# Patient Record
Sex: Female | Born: 1960 | Race: White | Hispanic: No | Marital: Married | Smoking: Never smoker
Health system: Southern US, Community
[De-identification: ages and names within clinical notes are randomized; demographics above are authoritative.]

## PROBLEM LIST (undated history)

## (undated) DIAGNOSIS — R7303 Prediabetes: Secondary | ICD-10-CM

## (undated) DIAGNOSIS — J309 Allergic rhinitis, unspecified: Secondary | ICD-10-CM

## (undated) DIAGNOSIS — F419 Anxiety disorder, unspecified: Secondary | ICD-10-CM

## (undated) DIAGNOSIS — M549 Dorsalgia, unspecified: Secondary | ICD-10-CM

## (undated) HISTORY — PX: DILATION AND CURETTAGE OF UTERUS: SHX78

## (undated) HISTORY — DX: Anxiety disorder, unspecified: F41.9

## (undated) HISTORY — DX: Dorsalgia, unspecified: M54.9

## (undated) HISTORY — DX: Allergic rhinitis, unspecified: J30.9

## (undated) HISTORY — DX: Prediabetes: R73.03

---

## 1996-05-18 HISTORY — PX: DIAGNOSTIC LAPAROSCOPY: SUR761

## 1997-05-18 HISTORY — PX: CHOLECYSTECTOMY: SHX55

## 2006-03-16 ENCOUNTER — Ambulatory Visit: Payer: Self-pay

## 2006-03-31 ENCOUNTER — Ambulatory Visit: Payer: Self-pay

## 2008-01-11 IMAGING — US ULTRASOUND RIGHT BREAST
1 series · 8 of 8 positions shown · non-contrast
Comparison: none

REASON FOR EXAM: Rt Breast Density
COMMENTS:

PROCEDURE:     US  - US BREAST RIGHT  - March 31, 2006  [DATE]
RESULT:          No cystic or solid abnormalities are identified.  A
negative ultrasound does not preclude biopsy if a palpable lesion is
present.

[Series 1: ultrasound right breast · 8 of 8 slices shown]
[im 1/8]
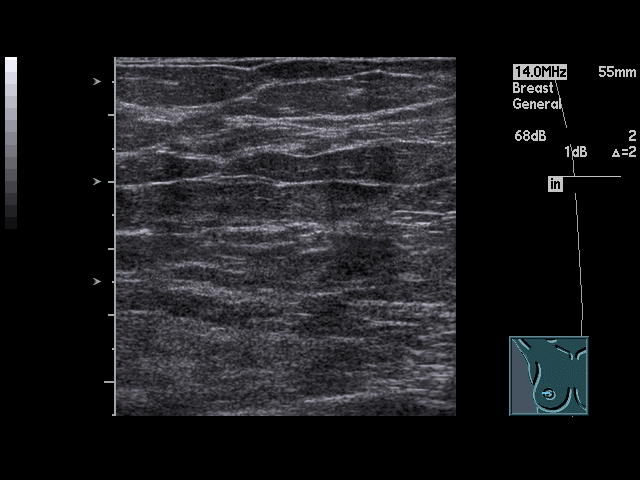
[im 2/8]
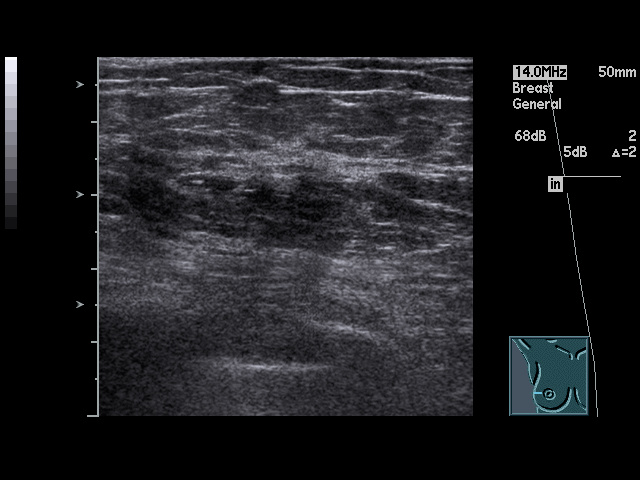
[im 3/8]
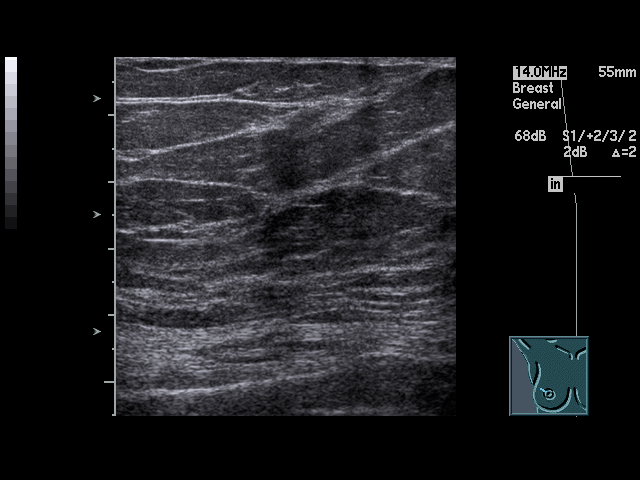
[im 4/8]
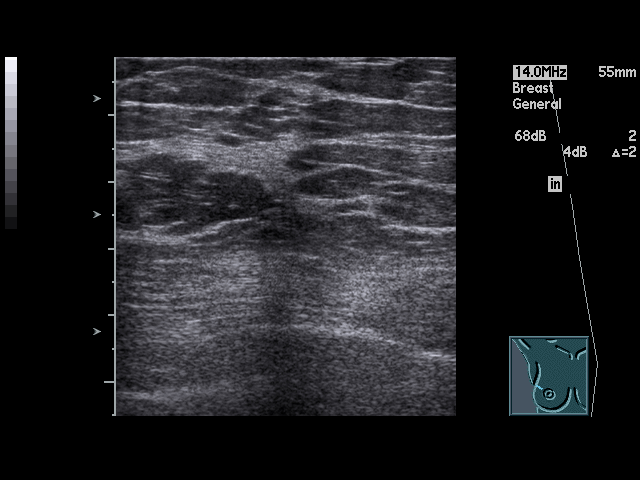
[im 5/8]
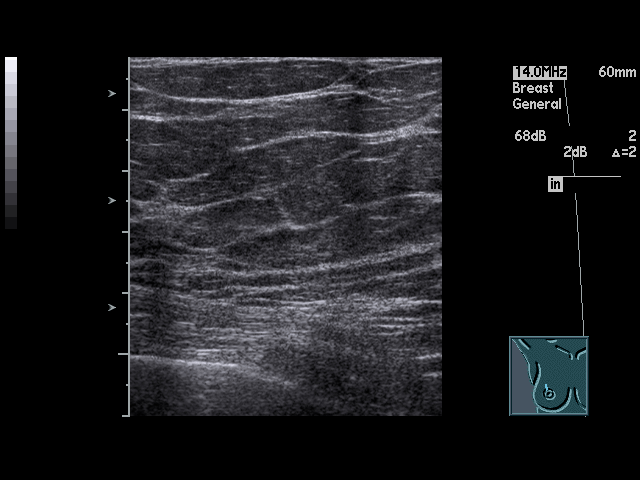
[im 6/8]
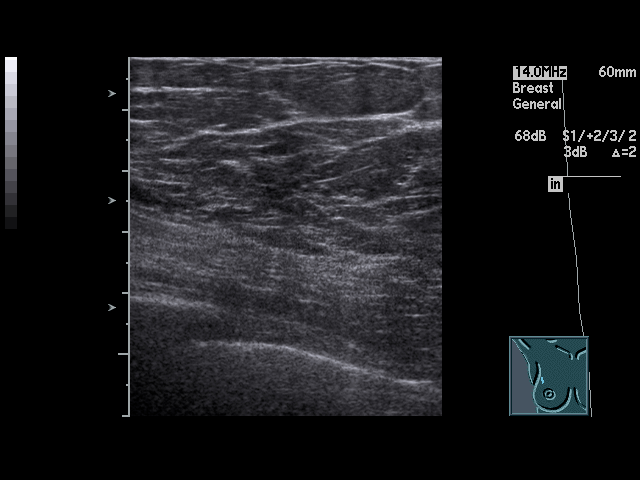
[im 7/8]
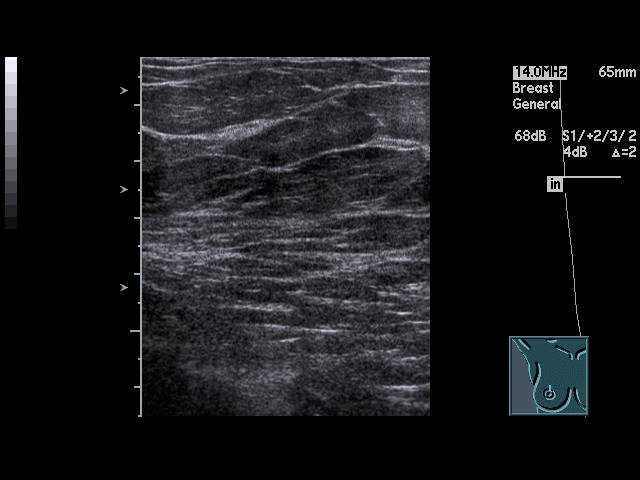
[im 8/8]
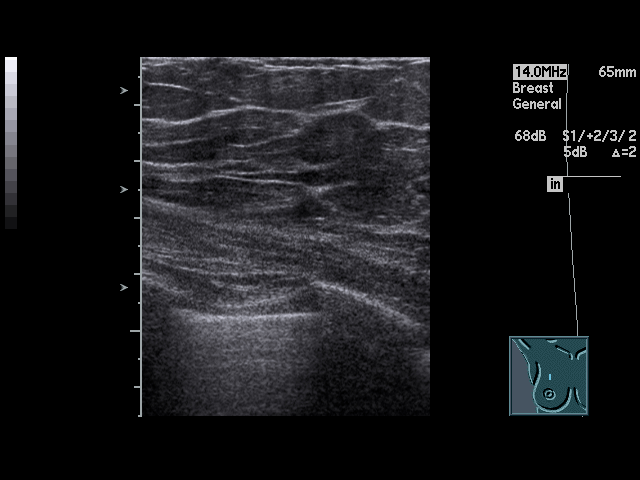

[8 of 8 positions shown; findings below may reference images not displayed]

IMPRESSION: Negative RIGHT breast ultrasound.

## 2014-02-17 LAB — HEPATIC FUNCTION PANEL
ALT: 23 U/L (ref 7–35)
AST: 20 U/L (ref 13–35)
Alkaline Phosphatase: 66 U/L (ref 25–125)
BILIRUBIN, TOTAL: 0.3 mg/dL

## 2014-02-17 LAB — BASIC METABOLIC PANEL
BUN: 12 mg/dL (ref 4–21)
Creatinine: 0.8 mg/dL (ref <=1.1)
Glucose: 108 mg/dL
Potassium: 4.7 mmol/L (ref 3.4–5.3)
SODIUM: 142 mmol/L (ref 137–147)

## 2014-02-17 LAB — CBC AND DIFFERENTIAL
HCT: 40 % (ref 36–46)
Hemoglobin: 13.6 g/dL (ref 12.0–16.0)
Neutrophils Absolute: 5 /uL
Platelets: 255 10*3/uL (ref 150–399)
WBC: 8.7 10*3/mL

## 2014-02-19 LAB — TSH: TSH: 3.16 u[IU]/mL (ref <=5.90)

## 2014-02-21 LAB — HEMOGLOBIN A1C: HEMOGLOBIN A1C: 5.7 % (ref 4.0–6.0)

## 2014-07-30 ENCOUNTER — Encounter: Payer: Self-pay | Admitting: *Deleted

## 2014-07-30 ENCOUNTER — Ambulatory Visit (INDEPENDENT_AMBULATORY_CARE_PROVIDER_SITE_OTHER): Payer: 59 | Admitting: Cardiovascular Disease

## 2014-07-30 ENCOUNTER — Encounter (INDEPENDENT_AMBULATORY_CARE_PROVIDER_SITE_OTHER): Payer: Self-pay

## 2014-07-30 VITALS — BP 118/70 | HR 82 | Ht 62.0 in | Wt 219.8 lb

## 2014-07-30 DIAGNOSIS — R079 Chest pain, unspecified: Secondary | ICD-10-CM

## 2014-07-30 DIAGNOSIS — Z6841 Body Mass Index (BMI) 40.0 and over, adult: Secondary | ICD-10-CM

## 2014-07-30 NOTE — Assessment & Plan Note (Signed)
She reports substernal chest tightness with some anginal of some atypical features. Most of the episodes are triggered by stress and anxiety. Cardiac exam is unremarkable. EKG with inverted T waves in the anterior and inferior leads. Due to that, I recommend evaluation with a treadmill nuclear stress test. Given her abnormal baseline ECG, a treadmill stress test alone is not interpretable.

## 2014-07-30 NOTE — Assessment & Plan Note (Signed)
Consider evaluation for sleep apnea given her symptoms of fatigue.

## 2014-07-30 NOTE — Patient Instructions (Addendum)
[facility]  Your caregiver has ordered a Stress Test with nuclear imaging. The purpose of this test is to evaluate the blood supply to your heart muscle. This procedure is referred to as a "Non-Invasive Stress Test." This is because other than having an IV started in your vein, nothing is inserted or "invades" your body. Cardiac stress tests are done to find areas of poor blood flow to the heart by determining the extent of coronary artery disease (CAD). Some patients exercise on a treadmill, which naturally increases the blood flow to your heart, while others who are  unable to walk on a treadmill due to physical limitations have a pharmacologic/chemical stress agent called Lexiscan . This medicine will mimic walking on a treadmill by temporarily increasing your coronary blood flow.   Please note: these test may take anywhere between 2-4 hours to complete  [facility]  THE VOLUNTEERS AT THE FIRST DESK WILL DIRECT YOU WHERE TO GO  Date of Procedure:______3/18/16_______________________________  Arrival Time for Procedure:______0745 am_______________________   PLEASE NOTIFY THE OFFICE AT LEAST 24 HOURS IN ADVANCE IF YOU ARE UNABLE TO KEEP YOUR APPOINTMENT.  [phone] AND  PLEASE NOTIFY [facility] AT LEAST 24 HOURS IN ADVANCE IF YOU ARE UNABLE TO KEEP YOUR APPOINTMENT. [phone]  How to prepare for your Myoview test:  1. Do not eat or drink after midnight 2. No caffeine for 24 hours prior to test 3. No smoking 24 hours prior to test. 4. Your medication may be taken with water.  If your doctor stopped a medication because of this test, do not take that medication. 5. Ladies, please do not wear dresses.  Skirts or pants are appropriate. Please wear a short sleeve shirt. 6. No perfume, cologne or lotion. 7. Wear comfortable walking shoes. No heels!   Your physician recommends that you schedule a follow-up appointment in:  As needed

## 2014-07-30 NOTE — Progress Notes (Signed)
  Primary care physician: Dr. Juanetta GoslingHawkins  HPI  This is a 54 year old female who was referred for evaluation of chest pain and abnormal EKG. She has known history of obesity, diet-controlled diabetes and allergic rhinitis. She has no history of hypertension, hyperlipidemia or tobacco use. She does not have family history of coronary artery disease. She complains of substernal chest tightness which started about 5 months ago and has been intermittent. This mostly happens when she is under stress but occasionally with physical activities but not on a consistent basis. She complains of fatigue. She complains of dizziness upon standing up. She is not aware of history of sleep apnea and has not been tested. She does complain of chronic exertional dyspnea. No orthopnea, PND or lower extremity edema. She is going through menopause. Recent EKGs were reviewed which showed sinus rhythm with nonspecific inferior and anterior T wave changes.  Allergies  Allergen Reactions  . Penicillins      No current outpatient prescriptions on file prior to visit.   No current facility-administered medications on file prior to visit.     Past Medical History  Diagnosis Date  . Back pain   . Obesity      Past Surgical History  Procedure Laterality Date  . Cholecystectomy       Family History  Problem Relation Age of Onset  . Family history unknown: Yes     History   Social History  . Marital Status: Married    Spouse Name: N/A  . Number of Children: N/A  . Years of Education: N/A   Occupational History  . Not on file.   Social History Main Topics  . Smoking status: Never Smoker   . Smokeless tobacco: Never Used  . Alcohol Use: No  . Drug Use: No  . Sexual Activity: Not on file   Other Topics Concern  . Not on file   Social History Narrative  . No narrative on file     ROS A 10 point review of system was performed. It is negative other than that mentioned in the history of present  illness.   PHYSICAL EXAM   BP 118/70 mmHg  Pulse 82  Ht 5\' 2"  (1.575 m)  Wt 219 lb 12 oz (99.678 kg)  BMI 40.18 kg/m2 Constitutional: She is oriented to person, place, and time. She appears well-developed and well-nourished. No distress.  HENT: No nasal discharge.  Head: Normocephalic and atraumatic.  Eyes: Pupils are equal and round. No discharge.  Neck: Normal range of motion. Neck supple. No JVD present. No thyromegaly present.  Cardiovascular: Normal rate, regular rhythm, normal heart sounds. Exam reveals no gallop and no friction rub. No murmur heard.  Pulmonary/Chest: Effort normal and breath sounds normal. No stridor. No respiratory distress. She has no wheezes. She has no rales. She exhibits no tenderness.  Abdominal: Soft. Bowel sounds are normal. She exhibits no distension. There is no tenderness. There is no rebound and no guarding.  Musculoskeletal: Normal range of motion. She exhibits no edema and no tenderness.  Neurological: She is alert and oriented to person, place, and time. Coordination normal.  Skin: Skin is warm and dry. No rash noted. She is not diaphoretic. No erythema. No pallor.  Psychiatric: She has a normal mood and affect. Her behavior is normal. Judgment and thought content normal.      ASSESSMENT AND PLAN

## 2014-08-03 ENCOUNTER — Other Ambulatory Visit: Payer: Self-pay

## 2014-08-03 DIAGNOSIS — R079 Chest pain, unspecified: Secondary | ICD-10-CM | POA: Diagnosis not present

## 2014-08-03 DIAGNOSIS — Z01818 Encounter for other preprocedural examination: Secondary | ICD-10-CM

## 2014-08-06 ENCOUNTER — Other Ambulatory Visit: Payer: Self-pay

## 2014-08-06 DIAGNOSIS — R079 Chest pain, unspecified: Secondary | ICD-10-CM

## 2014-08-10 ENCOUNTER — Telehealth: Payer: Self-pay | Admitting: *Deleted

## 2014-08-10 NOTE — Telephone Encounter (Signed)
erroe

## 2014-12-03 ENCOUNTER — Encounter: Payer: Self-pay | Admitting: Family Medicine

## 2014-12-03 ENCOUNTER — Ambulatory Visit (INDEPENDENT_AMBULATORY_CARE_PROVIDER_SITE_OTHER): Payer: 59 | Admitting: Family Medicine

## 2014-12-03 VITALS — BP 123/81 | HR 84 | Temp 98.9°F | Resp 17 | Ht 61.0 in | Wt 227.0 lb

## 2014-12-03 DIAGNOSIS — F3341 Major depressive disorder, recurrent, in partial remission: Secondary | ICD-10-CM

## 2014-12-03 DIAGNOSIS — F329 Major depressive disorder, single episode, unspecified: Secondary | ICD-10-CM

## 2014-12-03 DIAGNOSIS — F3340 Major depressive disorder, recurrent, in remission, unspecified: Secondary | ICD-10-CM | POA: Insufficient documentation

## 2014-12-03 DIAGNOSIS — F32A Depression, unspecified: Secondary | ICD-10-CM

## 2014-12-03 MED ORDER — ESCITALOPRAM OXALATE 10 MG PO TABS: 10.0000 mg | ORAL_TABLET | Freq: Every day | ORAL | Status: DC

## 2014-12-03 NOTE — Progress Notes (Signed)
Name: Haley Woods   MRN: [id number]    DOB: Nov 05, 1960   Date:12/03/2014       Progress Note  Subjective  Chief Complaint  Chief Complaint  Patient presents with  . Follow-up    4 month  . Depression    Patient states that the medication is working okay, she states that noticed medication makes it harder to get up in the morning.     HPI  For f/u of depression.  Has been on Lexapro x 6 weeks.  Taking 1/2 10 mg. Tablet daily.  Feeling of lack of care and decreased motiviaton.  Less anxious and agitated.  Feels about 4/10.   Past Medical History  Diagnosis Date  . Back pain   . Obesity   . Allergic rhinitis   . Anxiety   . Depression   . Pre-diabetes     History  Substance Use Topics  . Smoking status: Never Smoker   . Smokeless tobacco: Never Used  . Alcohol Use: No     Current outpatient prescriptions:  .  escitalopram (LEXAPRO) 10 MG tablet, Take 1 tablet by mouth daily., Disp: , Rfl:  .  fexofenadine (ALLEGRA) 180 MG tablet, Take 180 mg by mouth daily., Disp: , Rfl:  .  fluticasone (FLONASE) 50 MCG/ACT nasal spray, Place 2 sprays into both nostrils daily., Disp: , Rfl:  .  montelukast (SINGULAIR) 10 MG tablet, Take 10 mg by mouth at bedtime., Disp: , Rfl:  .  Multiple Vitamin (MULTIVITAMIN) tablet, Take 1 tablet by mouth daily., Disp: , Rfl:  .  naproxen (NAPROSYN) 500 MG tablet, Take 500 mg by mouth 2 (two) times daily as needed., Disp: , Rfl:  .  pseudoephedrine (SUDAFED) 30 MG tablet, Take 1 tablet by mouth as needed., Disp: , Rfl:  .  aspirin 81 MG tablet, Take 81 mg by mouth daily., Disp: , Rfl:  .  nitroGLYCERIN (NITROSTAT) 0.4 MG SL tablet, Place 1 tablet under the tongue as needed., Disp: , Rfl:   Allergies  Allergen Reactions  . Penicillins     Review of Systems  Constitutional: Negative for fever, chills and malaise/fatigue.  Eyes: Negative for blurred vision and double vision.  Respiratory: Negative for cough, sputum production, shortness of  breath and wheezing.   Cardiovascular: Negative for chest pain, palpitations, orthopnea, claudication and leg swelling.  Gastrointestinal: Negative for abdominal pain and blood in stool.  Genitourinary: Negative for dysuria, urgency and frequency.  Skin: Negative for rash.  Neurological: Negative for weakness and headaches.  Psychiatric/Behavioral: Positive for depression. The patient is not nervous/anxious.       Objective  Filed Vitals:   12/03/14 1436  BP: 123/81  Pulse: 84  Temp: 98.9 F (37.2 C)  TempSrc: Oral  Resp: 17  Height: 5\' 1"  (1.549 m)  Weight: 227 lb (102.967 kg)     Physical Exam  Constitutional: She is well-developed, well-nourished, and in no distress. No distress.  HENT:  Head: Normocephalic and atraumatic.  Neck: Normal range of motion. No thyromegaly present.  Cardiovascular: Normal rate, regular rhythm, normal heart sounds and intact distal pulses.  Exam reveals no gallop and no friction rub.   No murmur heard. Pulmonary/Chest: Effort normal and breath sounds normal. No respiratory distress. She has no wheezes. She has no rales.  Abdominal: Soft. Bowel sounds are normal. She exhibits no mass. There is no tenderness.  obese  Musculoskeletal: She exhibits no edema.  Lymphadenopathy:    She has no cervical adenopathy.  Psychiatric:  Affect still depressed.      No results found for this or any previous visit (from the past 2160 hour(s)).   Assessment & Plan  1. Depression

## 2014-12-03 NOTE — Patient Instructions (Signed)
She hs been taking 1/2 of a 10 mg. Lexapro daily for 6 weeks.  She is to increase this to 1 tablet daily starting tomorrow.

## 2015-02-13 ENCOUNTER — Encounter: Payer: Self-pay | Admitting: Family Medicine

## 2015-02-13 ENCOUNTER — Ambulatory Visit (INDEPENDENT_AMBULATORY_CARE_PROVIDER_SITE_OTHER): Payer: 59 | Admitting: Family Medicine

## 2015-02-13 VITALS — BP 112/74 | HR 74 | Temp 98.9°F | Resp 16 | Ht 61.0 in | Wt 220.4 lb

## 2015-02-13 DIAGNOSIS — F329 Major depressive disorder, single episode, unspecified: Secondary | ICD-10-CM

## 2015-02-13 DIAGNOSIS — F32A Depression, unspecified: Secondary | ICD-10-CM

## 2015-02-13 DIAGNOSIS — F419 Anxiety disorder, unspecified: Secondary | ICD-10-CM | POA: Insufficient documentation

## 2015-02-13 DIAGNOSIS — M549 Dorsalgia, unspecified: Secondary | ICD-10-CM | POA: Insufficient documentation

## 2015-02-13 DIAGNOSIS — J302 Other seasonal allergic rhinitis: Secondary | ICD-10-CM | POA: Insufficient documentation

## 2015-02-13 MED ORDER — ESCITALOPRAM OXALATE 20 MG PO TABS: 20.0000 mg | ORAL_TABLET | Freq: Every day | ORAL | Status: DC

## 2015-02-13 NOTE — Patient Instructions (Addendum)
Take Lexapro after getting home from work.   To get flu shot at work next month.

## 2015-02-13 NOTE — Progress Notes (Signed)
Name: Haley Woods   MRN: [id number]    DOB: 1960/05/19   Date:02/13/2015       Progress Note  Subjective  Chief Complaint  Chief Complaint  Patient presents with  . Depression    HPI Here for f/u of depression.  Overall feels better, but does feel sleepy a lot and some decrease motivation.  Anxitey, esp. With driving much better.  No problem-specific assessment & plan notes found for this encounter.   Past Medical History  Diagnosis Date  . Back pain   . Obesity   . Allergic rhinitis   . Anxiety   . Depression   . Pre-diabetes     Social History  Substance Use Topics  . Smoking status: Never Smoker   . Smokeless tobacco: Never Used  . Alcohol Use: No     Current outpatient prescriptions:  .  aspirin 81 MG tablet, Take 81 mg by mouth daily., Disp: , Rfl:  .  escitalopram (LEXAPRO) 10 MG tablet, Take 1 tablet (10 mg total) by mouth daily., Disp: 90 tablet, Rfl: 3 .  fexofenadine (ALLEGRA) 180 MG tablet, Take 180 mg by mouth daily., Disp: , Rfl:  .  fluticasone (FLONASE) 50 MCG/ACT nasal spray, Place 2 sprays into both nostrils daily., Disp: , Rfl:  .  montelukast (SINGULAIR) 10 MG tablet, Take 10 mg by mouth at bedtime., Disp: , Rfl:  .  Multiple Vitamin (MULTIVITAMIN) tablet, Take 1 tablet by mouth daily., Disp: , Rfl:  .  naproxen (NAPROSYN) 500 MG tablet, Take 500 mg by mouth 2 (two) times daily as needed., Disp: , Rfl:  .  nitroGLYCERIN (NITROSTAT) 0.4 MG SL tablet, Place 1 tablet under the tongue as needed., Disp: , Rfl:   Allergies  Allergen Reactions  . Penicillins     Review of Systems  Constitutional: Positive for malaise/fatigue. Negative for fever, chills and weight loss.  Eyes: Negative for blurred vision and double vision.  Respiratory: Negative for cough, sputum production, shortness of breath and wheezing.   Cardiovascular: Negative for chest pain, palpitations, orthopnea and leg swelling.  Gastrointestinal: Negative for heartburn, abdominal pain  and blood in stool.  Genitourinary: Negative for dysuria, urgency and frequency.  Musculoskeletal: Negative for myalgias and joint pain.  Neurological: Negative for weakness and headaches.  Psychiatric/Behavioral: Positive for depression (improved). The patient is nervous/anxious (improved).       Objective  Filed Vitals:   02/13/15 1325  BP: 112/74  Pulse: 74  Temp: 98.9 F (37.2 C)  TempSrc: Oral  Resp: 16  Height:  (1.549 m)  Weight: 220 lb 6.4 oz (99.973 kg)     Physical Exam  Constitutional: She is well-developed, well-nourished, and in no distress. No distress.  HENT:  Head: Normocephalic and atraumatic.  Eyes: Conjunctivae and EOM are normal. Pupils are equal, round, and reactive to light. No scleral icterus.  Neck: Normal range of motion. Neck supple. Carotid bruit is not present. No thyromegaly present.  Cardiovascular: Normal rate, regular rhythm, normal heart sounds and intact distal pulses.  Exam reveals no gallop and no friction rub.   No murmur heard. Pulmonary/Chest: Effort normal and breath sounds normal. No respiratory distress. She has no wheezes. She has no rales.  Musculoskeletal: She exhibits no edema.  Lymphadenopathy:    She has no cervical adenopathy.  Psychiatric:  Affect is sl. Flattened, but better than previously.  Vitals reviewed.     No results found for this or any previous visit (from the past  2160 hour(s)).   Assessment & Plan  1. Depression  - escitalopram (LEXAPRO) 20 MG tablet; Take 1 tablet (20 mg total) by mouth daily. Take in late afternoon.  Dispense: 30 tablet; Refill: 3  2. Anxiety  - escitalopram (LEXAPRO) 20 MG tablet; Take 1 tablet (20 mg total) by mouth daily. Take in late afternoon.  Dispense: 30 tablet; Refill: 3

## 2015-03-05 ENCOUNTER — Telehealth: Payer: Self-pay

## 2015-03-05 NOTE — Telephone Encounter (Signed)
Advised.JH  

## 2015-03-05 NOTE — Telephone Encounter (Signed)
Since increase to 2 Lexapro patient very sleepy. Wants to cut back on it.

## 2015-03-05 NOTE — Telephone Encounter (Signed)
Take 1 Lezapro a day for 1 week,then decrease to 1/2 tablet a day for 1 week, then 1/2 tab every other day for 1 week then stop.  We will try a different med at next visit.-jh

## 2015-03-27 ENCOUNTER — Encounter: Payer: Self-pay | Admitting: Family Medicine

## 2015-03-27 ENCOUNTER — Ambulatory Visit (INDEPENDENT_AMBULATORY_CARE_PROVIDER_SITE_OTHER): Payer: 59 | Admitting: Family Medicine

## 2015-03-27 VITALS — BP 132/87 | HR 102 | Temp 98.3°F | Resp 16 | Ht 61.0 in | Wt 221.6 lb

## 2015-03-27 DIAGNOSIS — J0101 Acute recurrent maxillary sinusitis: Secondary | ICD-10-CM | POA: Diagnosis not present

## 2015-03-27 MED ORDER — PSEUDOEPHEDRINE-GUAIFENESIN ER 60-600 MG PO TB12
1.0000 | ORAL_TABLET | Freq: Two times a day (BID) | ORAL | Status: DC
Start: 2015-03-27 — End: 2015-03-27

## 2015-03-27 MED ORDER — PSEUDOEPHEDRINE-GUAIFENESIN ER 60-600 MG PO TB12
1.0000 | ORAL_TABLET | Freq: Two times a day (BID) | ORAL | Status: DC
Start: 2015-03-27 — End: 2015-04-09

## 2015-03-27 MED ORDER — LEVOFLOXACIN 500 MG PO TABS: 500.0000 mg | ORAL_TABLET | Freq: Every day | ORAL | Status: AC

## 2015-03-27 MED ORDER — LEVOFLOXACIN 500 MG PO TABS: 500.0000 mg | ORAL_TABLET | Freq: Every day | ORAL | Status: DC

## 2015-03-27 MED ORDER — BENZONATATE 100 MG PO CAPS: 100.0000 mg | ORAL_CAPSULE | Freq: Three times a day (TID) | ORAL | Status: DC | PRN

## 2015-03-27 NOTE — Progress Notes (Signed)
Name: Haley Woods   MRN: [id number]    DOB: [birth year]   Date:03/27/2015       Progress Note  Subjective  Chief Complaint  Chief Complaint  Patient presents with  . Depression    still has some anxiety obtw pt has drainage and coughing at night onset 6 days no fever may be sinusitis and dizzy    HPI Here for f/u of depression and anxiety off meds, but c/o sinus infection with facial pressure beneath eyes and cough that is non-productive.  Cough is throaty.  No fever.    No problem-specific assessment & plan notes found for this encounter.   Past Medical History  Diagnosis Date  . Back pain   . Obesity   . Allergic rhinitis   . Anxiety   . Depression   . Pre-diabetes     Social History  Substance Use Topics  . Smoking status: Never Smoker   . Smokeless tobacco: Never Used  . Alcohol Use: No     Current outpatient prescriptions:  .  aspirin 81 MG tablet, Take 81 mg by mouth daily., Disp: , Rfl:  .  fexofenadine (ALLEGRA) 180 MG tablet, Take 180 mg by mouth daily., Disp: , Rfl:  .  fluticasone (FLONASE) 50 MCG/ACT nasal spray, Place 2 sprays into both nostrils daily., Disp: , Rfl:  .  montelukast (SINGULAIR) 10 MG tablet, Take 10 mg by mouth at bedtime., Disp: , Rfl:  .  Multiple Vitamin (MULTIVITAMIN) tablet, Take 1 tablet by mouth daily., Disp: , Rfl:  .  naproxen (NAPROSYN) 500 MG tablet, Take 500 mg by mouth 2 (two) times daily as needed., Disp: , Rfl:  .  nitroGLYCERIN (NITROSTAT) 0.4 MG SL tablet, Place 1 tablet under the tongue as needed., Disp: , Rfl:   Allergies  Allergen Reactions  . Penicillins     Review of Systems  Constitutional: Negative for fever, chills, weight loss and diaphoresis.  HENT: Negative for hearing loss.   Eyes: Negative for blurred vision and double vision.  Respiratory: Positive for cough and wheezing (some). Negative for sputum production and shortness of breath.   Cardiovascular: Negative for chest pain, palpitations and leg  swelling.  Gastrointestinal: Negative for heartburn, abdominal pain and blood in stool.  Genitourinary: Negative for dysuria, urgency and frequency.  Skin: Negative for rash.  Neurological: Negative for weakness and headaches.      Objective  Filed Vitals:   03/27/15 1354  BP: 132/87  Pulse: 102  Temp: 98.3 F (36.8 C)  TempSrc: Oral  Resp: 16  Height:  (1.549 m)  Weight: 221 lb 9.6 oz (100.517 kg)  SpO2: 98%     Physical Exam  Constitutional: She appears distressed (Appears to feel ill.).  HENT:  Head: Normocephalic and atraumatic.  Right Ear: External ear normal.  Left Ear: External ear normal.  Nose: Mucosal edema, rhinorrhea and sinus tenderness present. Right sinus exhibits maxillary sinus tenderness. Right sinus exhibits no frontal sinus tenderness. Left sinus exhibits maxillary sinus tenderness. Left sinus exhibits no frontal sinus tenderness.  Mouth/Throat: Oropharynx is clear and moist.  Neck: Normal range of motion. Neck supple. No thyromegaly present.  Cardiovascular: Normal rate, regular rhythm, normal heart sounds and intact distal pulses.  Exam reveals no gallop and no friction rub.   No murmur heard. Pulmonary/Chest: Effort normal and breath sounds normal. No respiratory distress. She has no wheezes. She has no rales.  Lymphadenopathy:    She has no cervical adenopathy.  Vitals  reviewed.     No results found for this or any previous visit (from the past 2160 hour(s)).   Assessment & Plan  1. Acute recurrent maxillary sinusitis  - levofloxacin (LEVAQUIN) 500 MG tablet; Take 1 tablet (500 mg total) by mouth daily.  Dispense: 10 tablet; Refill: 0 - pseudoephedrine-guaifenesin (MUCINEX D) 60-600 MG 12 hr tablet; Take 1 tablet by mouth every 12 (twelve) hours.  Dispense: 20 tablet; Refill: 0 - benzonatate (TESSALON PERLES) 100 MG capsule; Take 1 capsule (100 mg total) by mouth 3 (three) times daily as needed for cough.  Dispense: 30 capsule; Refill:  1

## 2015-03-27 NOTE — Patient Instructions (Signed)
Consider Cymbalta in the future.

## 2015-03-29 ENCOUNTER — Ambulatory Visit: Payer: 59 | Admitting: Family Medicine

## 2015-04-03 ENCOUNTER — Telehealth: Payer: Self-pay | Admitting: Family Medicine

## 2015-04-03 NOTE — Telephone Encounter (Signed)
Patient has one more day on abx (Levaquin). Patient is still taking Muccinex. Patient says ears are clogged up and she still has a lot of congestion. Patient says cough has gotten better. Please advise.

## 2015-04-03 NOTE — Telephone Encounter (Signed)
10 days of Levaquin are usually all that is needed.  If she has continued congestion, I would need to see her or refer her to ENT for further evaluation.   Levaquin is a major antibiotic.  She can be sure that she is taking an OTC antihistamine also (Claritin, Zyrtec, etc.).

## 2015-04-03 NOTE — Telephone Encounter (Signed)
Patient aware and will call back if she does not get bette in a few days.

## 2015-04-03 NOTE — Telephone Encounter (Signed)
Pt saw Dr. Juanetta GoslingHawkins on Wednesday last week and was prescribed antibiotics and OTC decongestant.  She will be out of both tomorrow.  Does she need to come in or can something be called in?  Her call back number is [phone]

## 2015-04-09 ENCOUNTER — Encounter: Payer: Self-pay | Admitting: Family Medicine

## 2015-04-09 ENCOUNTER — Ambulatory Visit (INDEPENDENT_AMBULATORY_CARE_PROVIDER_SITE_OTHER): Payer: 59 | Admitting: Family Medicine

## 2015-04-09 VITALS — BP 152/80 | HR 67 | Temp 98.6°F | Resp 16 | Ht 61.0 in | Wt 223.0 lb

## 2015-04-09 DIAGNOSIS — R059 Cough, unspecified: Secondary | ICD-10-CM

## 2015-04-09 DIAGNOSIS — J0101 Acute recurrent maxillary sinusitis: Secondary | ICD-10-CM | POA: Diagnosis not present

## 2015-04-09 DIAGNOSIS — R05 Cough: Secondary | ICD-10-CM

## 2015-04-09 DIAGNOSIS — R030 Elevated blood-pressure reading, without diagnosis of hypertension: Secondary | ICD-10-CM

## 2015-04-09 DIAGNOSIS — IMO0001 Reserved for inherently not codable concepts without codable children: Secondary | ICD-10-CM

## 2015-04-09 MED ORDER — PROMETHAZINE-CODEINE 6.25-10 MG/5ML PO SYRP: 5.0000 mL | ORAL_SOLUTION | Freq: Four times a day (QID) | ORAL | Status: DC | PRN

## 2015-04-09 MED ORDER — CHLORPHENIRAMINE-CODEINE 2-10 MG/5ML PO LIQD: 5.0000 mL | Freq: Every evening | ORAL | Status: DC | PRN

## 2015-04-09 MED ORDER — PSEUDOEPHEDRINE HCL 60 MG PO TABS: 60.0000 mg | ORAL_TABLET | Freq: Four times a day (QID) | ORAL | Status: DC | PRN

## 2015-04-09 MED ORDER — DM-GUAIFENESIN ER 30-600 MG PO TB12: 1.0000 | ORAL_TABLET | Freq: Two times a day (BID) | ORAL | Status: DC

## 2015-04-09 NOTE — Addendum Note (Signed)
Addended byLaroy Apple: Ludy Messamore L on: 04/09/2015 01:44 PM   Modules accepted: Orders, Medications

## 2015-04-09 NOTE — Progress Notes (Signed)
Subjective:    Patient ID: Haley Woods, female    DOB: 05/24/1960, 54 y.o.   MRN: [id number]  HPI: Haley Woods is a 54 y.o. female presenting on 04/09/2015 for Cough and Nasal Congestion   HPI  Pt presents for follow-up of cough, cold, congestion. Symptoms have been present for 2 weeks. She was previously treated with levaquin.  Symptoms were improving but symptoms worsened again on Saturday.  Ear congestion is better. Nasal congestion has worsened. No fevers at home. Pressure is resolved since antibiotic. Home treatment: Mucinex D, flonase, allegra, singulair at night. Pt was using tessalon perles.   Past Medical History  Diagnosis Date  . Back pain   . Obesity   . Allergic rhinitis   . Anxiety   . Depression   . Pre-diabetes     Current Outpatient Prescriptions on File Prior to Visit  Medication Sig  . aspirin 81 MG tablet Take 81 mg by mouth daily.  . fexofenadine (ALLEGRA) 180 MG tablet Take 180 mg by mouth daily.  . fluticasone (FLONASE) 50 MCG/ACT nasal spray Place 2 sprays into both nostrils daily.  . montelukast (SINGULAIR) 10 MG tablet Take 10 mg by mouth at bedtime.  . Multiple Vitamin (MULTIVITAMIN) tablet Take 1 tablet by mouth daily.  . naproxen (NAPROSYN) 500 MG tablet Take 500 mg by mouth 2 (two) times daily as needed.  . nitroGLYCERIN (NITROSTAT) 0.4 MG SL tablet Place 1 tablet under the tongue as needed.   No current facility-administered medications on file prior to visit.    Review of Systems  Constitutional: Positive for chills.  HENT: Positive for congestion, postnasal drip and rhinorrhea. Negative for ear pain, sinus pressure, sneezing and sore throat.   Respiratory: Positive for cough. Negative for chest tightness and wheezing.   Cardiovascular: Negative for chest pain and palpitations.  Gastrointestinal: Negative.  Negative for nausea, vomiting and diarrhea.  Musculoskeletal: Negative for neck pain and neck stiffness.  Neurological: Positive for  headaches.   Per HPI unless specifically indicated above     Objective:    BP 152/80 mmHg  Pulse 67  Temp(Src) 98.6 F (37 C) (Oral)  Resp 16  Ht  (1.549 m)  Wt 223 lb (101.152 kg)  BMI 42.16 kg/m2  Wt Readings from Last 3 Encounters:  04/09/15 223 lb (101.152 kg)  03/27/15 221 lb 9.6 oz (100.517 kg)  02/13/15 220 lb 6.4 oz (99.973 kg)    Physical Exam  Constitutional: She appears well-developed and well-nourished. No distress.  HENT:  Head: Normocephalic.  Right Ear: Tympanic membrane is not erythematous and not bulging.  Left Ear: Tympanic membrane is not erythematous and not bulging.  Nose: Mucosal edema, rhinorrhea and nasal septal hematoma present. No sinus tenderness. Right sinus exhibits maxillary sinus tenderness and frontal sinus tenderness. Left sinus exhibits no maxillary sinus tenderness and no frontal sinus tenderness.  Mouth/Throat: Uvula is midline and mucous membranes are normal. No uvula swelling. Posterior oropharyngeal erythema present. No posterior oropharyngeal edema.  Drainage seen in the back of the throat.   Neck: Neck supple. No Brudzinski's sign and no Kernig's sign noted.  Cardiovascular: Normal rate, regular rhythm and normal heart sounds.   Pulmonary/Chest: Breath sounds normal. No accessory muscle usage. No tachypnea. No respiratory distress.  Lymphadenopathy:    She has no cervical adenopathy.   Results for orders placed or performed in visit on 12/03/14  CBC and differential  Result Value Ref Range   Hemoglobin 13.6 12.0 -  16.0 g/dL   HCT 40 36 - 46 %   Neutrophils Absolute 5 /L   Platelets 255 150 - 399 K/L   WBC 8.7 10^3/mL  Basic metabolic panel  Result Value Ref Range   Glucose 108 mg/dL   BUN 12 4 - 21 mg/dL   Creatinine 0.8 .5 - 1.1 mg/dL   Potassium 4.7 3.4 - 5.3 mmol/L   Sodium 142 137 - 147 mmol/L  Hepatic function panel  Result Value Ref Range   Alkaline Phosphatase 66 25 - 125 U/L   ALT 23 7 - 35 U/L   AST 20 13 -  35 U/L   Bilirubin, Total 0.3 mg/dL  Hemoglobin H0QA1c  Result Value Ref Range   Hgb A1c MFr Bld 5.7 4.0 - 6.0 %  TSH  Result Value Ref Range   TSH 3.16 .41 - 5.90 uIU/mL      Assessment & Plan:   Problem List Items Addressed This Visit      Respiratory   Acute recurrent maxillary sinusitis - Primary    Infection appears to be resolved. Symptoms are likely lingering drainage. Increase sudafed. Continue antihistamine and flonase. Consider prednisone if symptoms not resolving. Consider ENT referral with recurrent symptoms.       Relevant Medications   dextromethorphan-guaiFENesin (MUCINEX DM) 30-600 MG 12hr tablet   pseudoephedrine (SUDAFED) 60 MG tablet   Chlorpheniramine-Codeine 2-10 MG/5ML LIQD    Other Visit Diagnoses    Cough        Likely upper airway cough syndrome- post nasal drip. Increase sudafed. Mucinex DM, and cough suppressant at night. Supportive care at home.     Relevant Medications    dextromethorphan-guaiFENesin (MUCINEX DM) 30-600 MG 12hr tablet    Chlorpheniramine-Codeine 2-10 MG/5ML LIQD    Elevated BP        BP elevated in the office today. Likely due to sudafed use and illness. Pt encouraged to monitor at home. Alarm symptoms reviewed.        Meds ordered this encounter  Medications  . dextromethorphan-guaiFENesin (MUCINEX DM) 30-600 MG 12hr tablet    Sig: Take 1 tablet by mouth 2 (two) times daily.    Dispense:  20 tablet    Refill:  0    Order Specific Question:  Supervising Provider    Answer:  Janeann ForehandHAWKINS JR, JAMES H [phone][[id number]]  . pseudoephedrine (SUDAFED) 60 MG tablet    Sig: Take 1 tablet (60 mg total) by mouth every 6 (six) hours as needed for congestion.    Dispense:  30 tablet    Refill:  0    Order Specific Question:  Supervising Provider    Answer:  Janeann ForehandHAWKINS JR, JAMES H [phone][[id number]]  . Chlorpheniramine-Codeine 2-10 MG/5ML LIQD    Sig: Take 5 mLs by mouth at bedtime as needed.    Dispense:  100 mL    Refill:  0    Order Specific Question:   Supervising Provider    Answer:  Janeann ForehandHAWKINS JR, JAMES H [[id number]]      Follow up plan: Return if symptoms worsen or fail to improve.

## 2015-04-09 NOTE — Assessment & Plan Note (Addendum)
Infection appears to be resolved. Symptoms are likely lingering drainage. Increase sudafed. Continue antihistamine and flonase. Consider prednisone if symptoms not resolving. Consider ENT referral with recurrent symptoms.

## 2015-04-09 NOTE — Patient Instructions (Signed)
I think your infection is resolved but the drainage is causing additional coughing.  Let's try taking a decongestant  (sudafed) three times daily.  Continue to take anti-histamine daily.  Saline nasal spray.  I suggest mucinex DM for cough. You can take that twice daily. I have given you a stronger cough medicine for a night.

## 2015-04-29 ENCOUNTER — Ambulatory Visit: Payer: 59 | Admitting: Family Medicine

## 2015-08-18 ENCOUNTER — Other Ambulatory Visit: Payer: Self-pay | Admitting: Family Medicine

## 2016-01-12 ENCOUNTER — Other Ambulatory Visit: Payer: Self-pay | Admitting: Family Medicine

## 2016-01-14 LAB — HEMOGLOBIN A1C: Hemoglobin A1C: 5.6

## 2016-01-14 LAB — LIPID PANEL
Cholesterol: 244 — AB (ref 0–200)
HDL: 60 (ref 35–70)
LDL Cholesterol: 157
TRIGLYCERIDES: 135 (ref 40–160)

## 2016-01-14 LAB — BASIC METABOLIC PANEL
Creatinine: 0.8 (ref 0.5–1.1)
GLUCOSE: 110

## 2016-02-14 ENCOUNTER — Ambulatory Visit (INDEPENDENT_AMBULATORY_CARE_PROVIDER_SITE_OTHER): Payer: 59 | Admitting: Family Medicine

## 2016-02-14 ENCOUNTER — Encounter: Payer: Self-pay | Admitting: Family Medicine

## 2016-02-14 VITALS — BP 121/84 | HR 74 | Temp 98.8°F | Resp 16 | Ht 61.0 in | Wt 216.0 lb

## 2016-02-14 DIAGNOSIS — E668 Other obesity: Secondary | ICD-10-CM | POA: Diagnosis not present

## 2016-02-14 DIAGNOSIS — IMO0002 Reserved for concepts with insufficient information to code with codable children: Secondary | ICD-10-CM

## 2016-02-14 NOTE — Patient Instructions (Signed)
To get flu shot at work.

## 2016-02-14 NOTE — Progress Notes (Signed)
Name: Haley Woods   MRN: [id number]    DOB: [birth year]   Date:02/14/2016       Progress Note  Subjective  Chief Complaint  Chief Complaint  Patient presents with  . Obesity    HPI Here to fill out form to request lower insurance rate because of obesity.  She has lost 7# over past 10 months.  She has changed to a diet higher in fats but lower in carbs.  She has not started any exercise program.    No problem-specific Assessment & Plan notes found for this encounter.   Past Medical History:  Diagnosis Date  . Allergic rhinitis   . Anxiety   . Back pain   . Depression   . Obesity   . Pre-diabetes     Past Surgical History:  Procedure Laterality Date  . CHOLECYSTECTOMY  1999    Family History  Problem Relation Age of Onset  . Colon cancer Father 68    Social History   Social History  . Marital status: Married    Spouse name: Dwayne  . Number of children: 1  . Years of education: N/A   Occupational History  .  [employer]   Social History Main Topics  . Smoking status: Never Smoker  . Smokeless tobacco: Never Used  . Alcohol use No  . Drug use: No  . Sexual activity: Not on file   Other Topics Concern  . Not on file   Social History Narrative  . No narrative on file     Current Outpatient Prescriptions:  .  fexofenadine (ALLEGRA) 180 MG tablet, Take 180 mg by mouth daily., Disp: , Rfl:  .  fluticasone (FLONASE) 50 MCG/ACT nasal spray, Use 2 sprays in each  nostril daily, Disp: 48 g, Rfl: 0 .  Multiple Vitamin (MULTIVITAMIN) tablet, Take 1 tablet by mouth daily., Disp: , Rfl:  .  naproxen (NAPROSYN) 500 MG tablet, Take 500 mg by mouth 2 (two) times daily as needed., Disp: , Rfl:  .  nitroGLYCERIN (NITROSTAT) 0.4 MG SL tablet, Place 1 tablet under the tongue as needed., Disp: , Rfl:   Allergies  Allergen Reactions  . Penicillins      Review of Systems  Constitutional: Positive for weight loss (mild, desired). Negative for chills, fever and  malaise/fatigue.  HENT: Negative for hearing loss.   Eyes: Negative for blurred vision and double vision.  Respiratory: Negative for cough, sputum production, shortness of breath and wheezing.   Cardiovascular: Negative for chest pain, palpitations and leg swelling.  Gastrointestinal: Negative for abdominal pain, blood in stool and heartburn.  Genitourinary: Negative for dysuria, frequency, hematuria and urgency.  Musculoskeletal: Negative for joint pain and myalgias.  Skin: Negative for rash.  Neurological: Negative for dizziness, tremors, weakness and headaches.      Objective  Vitals:   02/14/16 0824  BP: 121/84  Pulse: 74  Resp: 16  Temp: 98.8 F (37.1 C)  TempSrc: Oral  Weight: 216 lb (98 kg)  Height: 5\' 1"  (1.549 m)    Physical Exam  Constitutional: She is oriented to person, place, and time and well-developed, well-nourished, and in no distress. No distress.  HENT:  Head: Normocephalic and atraumatic.  Eyes: Conjunctivae and EOM are normal. Pupils are equal, round, and reactive to light. No scleral icterus.  Neck: Normal range of motion. Neck supple. Carotid bruit is not present. No thyromegaly present.  Cardiovascular: Normal rate, regular rhythm and normal heart sounds.  Exam reveals no gallop  and no friction rub.   No murmur heard. Pulmonary/Chest: Effort normal and breath sounds normal. No respiratory distress. She has no wheezes. She has no rales.  Abdominal: Soft. Bowel sounds are normal. She exhibits no distension, no abdominal bruit and no mass. There is no tenderness.  obese  Musculoskeletal: She exhibits no edema.  Lymphadenopathy:    She has no cervical adenopathy.  Neurological: She is alert and oriented to person, place, and time.  Vitals reviewed.      No results found for this or any previous visit (from the past 2160 hour(s)).   Assessment & Plan  Problem List Items Addressed This Visit      Other   Adult BMI 30+ - Primary    Other  Visit Diagnoses   None.     No orders of the defined types were placed in this encounter. 1. Adult BMI 30+ Discussed further calorie reduction and adding exercise to regemin to lose more weight.  Requested exception to attaining ideal weight by date desired by empoyer

## 2016-05-31 ENCOUNTER — Other Ambulatory Visit: Payer: Self-pay | Admitting: Family Medicine

## 2016-08-17 ENCOUNTER — Ambulatory Visit: Payer: 59 | Admitting: Family Medicine

## 2017-01-06 LAB — LIPID PANEL
CHOLESTEROL: 225 — AB (ref 0–200)
HDL: 55 (ref 35–70)
LDL Cholesterol: 141
TRIGLYCERIDES: 146 (ref 40–160)

## 2017-01-06 LAB — BASIC METABOLIC PANEL
CREATININE: 0.8 (ref 0.5–1.1)
Glucose: 102

## 2017-01-06 LAB — HEMOGLOBIN A1C: Hemoglobin A1C: 5.6

## 2017-01-25 ENCOUNTER — Ambulatory Visit (INDEPENDENT_AMBULATORY_CARE_PROVIDER_SITE_OTHER): Payer: 59 | Admitting: Family Medicine

## 2017-01-25 ENCOUNTER — Encounter: Payer: Self-pay | Admitting: Family Medicine

## 2017-01-25 VITALS — BP 130/82 | HR 59 | Temp 98.6°F | Resp 16 | Ht 61.0 in | Wt 224.6 lb

## 2017-01-25 DIAGNOSIS — Z1231 Encounter for screening mammogram for malignant neoplasm of breast: Secondary | ICD-10-CM | POA: Diagnosis not present

## 2017-01-25 DIAGNOSIS — Z0000 Encounter for general adult medical examination without abnormal findings: Secondary | ICD-10-CM | POA: Diagnosis not present

## 2017-01-25 DIAGNOSIS — Z1211 Encounter for screening for malignant neoplasm of colon: Secondary | ICD-10-CM | POA: Diagnosis not present

## 2017-01-25 DIAGNOSIS — R7303 Prediabetes: Secondary | ICD-10-CM

## 2017-01-25 DIAGNOSIS — Z1239 Encounter for other screening for malignant neoplasm of breast: Secondary | ICD-10-CM

## 2017-01-25 DIAGNOSIS — E7800 Pure hypercholesterolemia, unspecified: Secondary | ICD-10-CM | POA: Diagnosis not present

## 2017-01-25 DIAGNOSIS — E785 Hyperlipidemia, unspecified: Secondary | ICD-10-CM | POA: Insufficient documentation

## 2017-01-25 NOTE — Assessment & Plan Note (Signed)
Uncontrolled with poor lifestyle diet/exercise, now trying to improve. 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 - Recent wt gain 9 lbs in 1 year, some fluctuating weight - Never on wt loss meds - Concern Pre-DM, HLD. Elevated BP but improved re-check never HTN  Plan: 1. Reviewed wt loss strategies - start with improvements to diet and lifestyle, low carb, low portion, should eat breakfast and fast in evening if preferred, avoid late night eating, start cardio exercise more regularly 2. Offered nutritionist referral, she declined, can go to one at work [employer] 3. Consider future referral [facility] Dr Dalbert GarnetBeasley 4. Also consider med with Contrave in future - may check cost 5. Follow-up 6 months wt check

## 2017-01-25 NOTE — Assessment & Plan Note (Signed)
Uncontrolled cholesterol poor lifestyle, not on statin Last lipid panel 12/2016 - [employer] work biometric, entered Calculated ASCVD 10 yr risk score 2.3%  Plan: 1. Consider future ASA 81mg  in future now age 55>55 if inc risk factors for CVA 2. Encourage improved lifestyle - low carb/cholesterol, reduce portion size, start regular exercise 3. Yearly lipids

## 2017-01-25 NOTE — Progress Notes (Signed)
Subjective:    Patient ID: Haley Woods, female    DOB: Feb 15, 1961, 56 y.o.   MRN: [id number]  Haley Woods is a 56 y.o. female presenting on 01/25/2017 for Annual Exam   HPI   Here for Annual Physical and [employer] Biometric Lab Review / BMI Appeal  MORBID OBESITY BMI >42 - Reports that she would like to appeal the biometric BMI, now with not meeting requirement, same thing last year, states frustrating losing weight, body not responding to things that used to work in past. 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 - Never on medication for weight loss. Has not seen nutritionist Lifestyle: - Diet: Tries to work on low carb foods, poorer diet with acting as caregiver for family. Recently started intermediate fasting avoiding eating after 8pm evening also some fasting in morning, missing breakfast. Drinks some half and half tea, no more sodas and diet drinks. Drinks some water. - Exiercise: Home exercises on exercise ball and stretches, tries to do some every day, none recently. She may start walking at work - Also last menstrual period was August 2017, went through menopause. Admits some hormonal - Fam history of thyroid problem in father, older age. Her last TSH normal 2015. - No prior history of sleep apnea, never had PSG, denies snoring or apnea events  HYPERLIPIDEMIA: - Reports concerns with last lipids biometrics, 12/2016. Elevated TC, LDL  Pre-Diabetes Reports concerns with history of Pre-DM, recent A1c unchanged 5.6 from outside [employer] Never on meds for glucose.  Never diagnosed with HTN.  History of Depression, more with adjustment disorder and caregiver burden / stress, has been improved recently, no longer interested in anti-depressant medications.  Health Maintenance: - Cervical CA Screening: Last pap smear done in  02/2014, due anytime, unsure of result with HPV co testing, but reported "normal", she has [facility], will return when ready - Due for routine Hep C screening, has not had before that she recalls, not interested today - Colon CA Screening: Never had colonoscopy or other screening. Family history father passed away from colon cancer dx 71, passed 78. Will consider Cologuard. - Breast CA Screening: Last mammogram 3 years ago, previously normal, [facility] found abnormal scar tissue, then   Past Medical History:  Diagnosis Date  . Allergic rhinitis   . Anxiety   . Back pain   . Depression   . Obesity   . Pre-diabetes    Past Surgical History:  Procedure Laterality Date  . CHOLECYSTECTOMY  1999   Social History   Social History  . Marital status: Married    Spouse name: Dwayne  . Number of children: 1  . Years of education: N/A   Occupational History  .  [employer]   Social History Main Topics  . Smoking status: Never Smoker  . Smokeless tobacco: Never Used  . Alcohol use No  . Drug use: No  . Sexual activity: Not on file   Other Topics Concern  . Not on file   Social History Narrative  . No narrative on file   Family History  Problem Relation Age of Onset  . Heart disease Mother   . Congestive Heart Failure Mother   . Colon cancer Father 76       passed recurrence age 97   Current Outpatient  Prescriptions on File Prior to Visit  Medication Sig  . fexofenadine (ALLEGRA) 180 MG tablet Take 180 mg by mouth daily.  . fluticasone (FLONASE) 50 MCG/ACT nasal spray USE 2 SPRAYS IN EACH  NOSTRIL DAILY  . Multiple Vitamin (MULTIVITAMIN) tablet Take 1 tablet by mouth daily.  . naproxen (NAPROSYN) 500 MG tablet Take 500 mg by mouth 2 (two) times daily as needed.  . nitroGLYCERIN (NITROSTAT) 0.4 MG SL tablet Place 1 tablet under the tongue as needed.   No current facility-administered medications on file prior to visit.     Review of Systems    Constitutional: Negative for activity change, appetite change, chills, diaphoresis, fatigue, fever and unexpected weight change.  HENT: Negative for congestion, hearing loss and sinus pressure.   Eyes: Negative for visual disturbance.  Respiratory: Negative for apnea, cough, choking, chest tightness, shortness of breath and wheezing.   Cardiovascular: Negative for chest pain, palpitations and leg swelling.  Gastrointestinal: Negative for abdominal pain, anal bleeding, blood in stool, constipation, diarrhea, nausea and vomiting.  Endocrine: Negative for cold intolerance and polyuria.  Genitourinary: Negative for difficulty urinating, dysuria, frequency, hematuria and menstrual problem (amenorrhea for 1 year).  Musculoskeletal: Negative for arthralgias, back pain and neck pain.  Skin: Negative for rash.  Allergic/Immunologic: Negative for environmental allergies.  Neurological: Negative for dizziness, weakness, light-headedness, numbness and headaches.  Hematological: Negative for adenopathy.  Psychiatric/Behavioral: Negative for behavioral problems, dysphoric mood and sleep disturbance. The patient is not nervous/anxious.    Per HPI unless specifically indicated above     Objective:    BP 130/82 (BP Location: Right Arm, Cuff Size: Large)   Pulse (!) 59   Temp 98.6 F (37 C) (Oral)   Resp 16   Ht  (1.549 m)   Wt 224 lb 9.6 oz (101.9 kg)   LMP 01/17/2016 (Within Weeks)   BMI 42.44 kg/m   Wt Readings from Last 3 Encounters:  01/25/17 224 lb 9.6 oz (101.9 kg)  02/14/16 216 lb (98 kg)  04/09/15 223 lb (101.2 kg)    Physical Exam  Constitutional: She is oriented to person, place, and time. She appears well-developed and well-nourished. No distress.  Well-appearing, comfortable, cooperative, obese  HENT:  Head: Normocephalic and atraumatic.  Mouth/Throat: Oropharynx is clear and moist.  Frontal / maxillary sinuses non-tender. Nares patent without purulence or edema. Bilateral  TMs clear without erythema, effusion or bulging. Oropharynx clear without erythema, exudates, edema or asymmetry.  Eyes: Pupils are equal, round, and reactive to light. Conjunctivae and EOM are normal. Right eye exhibits no discharge. Left eye exhibits no discharge.  Neck: Normal range of motion. Neck supple. No thyromegaly present.  Cardiovascular: Normal rate, regular rhythm, normal heart sounds and intact distal pulses.   No murmur heard. Pulmonary/Chest: Effort normal and breath sounds normal. No respiratory distress. She has no wheezes. She has no rales.  Abdominal: Soft. Bowel sounds are normal. She exhibits no distension and no mass. There is no tenderness.  Musculoskeletal: Normal range of motion. She exhibits no edema or tenderness.  Upper / Lower Extremities: - Normal muscle tone, strength bilateral upper extremities 5/5, lower extremities 5/5  Lymphadenopathy:    She has no cervical adenopathy.  Neurological: She is alert and oriented to person, place, and time.  Distal sensation intact to light touch all extremities  Skin: Skin is warm and dry. No rash noted. She is not diaphoretic. No erythema.  Psychiatric: She has a normal mood and affect. Her behavior is  normal.  Well groomed, good eye contact, normal speech and thoughts  Nursing note and vitals reviewed.  Results for orders placed or performed in visit on 01/25/17  Basic metabolic panel  Result Value Ref Range   Glucose 110    Creatinine 0.8 0.5 - 1.1  Lipid panel  Result Value Ref Range   Triglycerides 135 40 - 160   Cholesterol 244 (A) 0 - 200   HDL 60 35 - 70   LDL Cholesterol 157   Hemoglobin A1c  Result Value Ref Range   Hemoglobin A1C 5.6   Basic metabolic panel  Result Value Ref Range   Glucose 102    Creatinine 0.8 0.5 - 1.1  Lipid panel  Result Value Ref Range   Triglycerides 146 40 - 160   Cholesterol 225 (A) 0 - 200   HDL 55 35 - 70   LDL Cholesterol 141   Hemoglobin A1c  Result Value Ref Range     Hemoglobin A1C 5.6       Assessment & Plan:   Problem List Items Addressed This Visit    Screening for colon cancer    Due for routine colon cancer screening. Never had colonoscopy (not interested), concern with family history colon cancer, father dx late [id number] and passed [id number] - Discussion today about recommendations for either Colonoscopy or Cologuard screening, benefits and risks of screening, interested in Cologuard, understands that if positive then recommendation is for diagnostic colonoscopy to follow-up. - Patient advised to contact insurance first to learn cost, will notify us when ready for us to order Cologuard      Pre-diabetes    Well-controlled Pre-DM with A1c 5.6, unchanged in 1 year, stable but with poor lifestyle Concern with obesity, HLD  Plan:  1. Not on any therapy currently  2. Encourage improved lifestyle - low carb, low sugar diet, reduce portion size, start regular exercise more cardio 3. Follow-up 6 months wt check, consider adding low dose metformin in future if still pre-DM      Morbid obesity (HCC)    Uncontrolled with poor lifestyle diet/exercise, now trying to improve. 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 - Recent wt gain 9 lbs in 1 year, some fluctuating weight - Never on wt loss meds - Concern Pre-DM, HLD. Elevated BP but improved re-check never HTN  Plan: 1. Reviewed wt loss strategies - start with improvements to diet and lifestyle, low carb, low portion, should eat breakfast and fast in evening if preferred, avoid late night eating, start cardio exercise more regularly 2. Offered nutritionist referral, she declined, can go to one at work [facility] 3. Consider future referral [facility] Dr Dalbert GarnetBeasley 4. Also consider med with Contrave in future - may check cost 5. Follow-up 6 months wt check      Hyperlipidemia    Uncontrolled cholesterol poor lifestyle, not on statin Last lipid panel 12/2016 - [facility] work  biometric, entered Calculated ASCVD 10 yr risk score 2.3%  Plan: 1. Consider future ASA 81mg  in future now age 36>55 if inc risk factors for CVA 2. Encourage improved lifestyle - low carb/cholesterol, reduce portion size, start regular exercise 3. Yearly lipids       Other Visit Diagnoses    Annual physical exam    -  Primary   Screening for breast cancer       Recommend mammogram screening, order placed, she will call [place] to schedule, concern with cost of last diagnostic by report.  Relevant Orders   MM DIGITAL SCREENING BILATERAL      No orders of the defined types were placed in this encounter.   Follow up plan: Return in about 6 months (around 07/25/2017) for Weight Check.  Completed labwork work biometric screening, appeal for failed BMI. Returned to patient, copy for chart.  Saralyn Pilar, DO [facility] 01/25/2017, 5:20 PM

## 2017-01-25 NOTE — Patient Instructions (Addendum)
Thank you for coming to the clinic today.  1. Completed BMI paperwork  2. Consider medication, Contrave in future, may check to see if insurance covers it  ------------------------------------------------------------------ WEIGHT MANAGEMENT  Dr Dennard Nip  [facility] [place], [place] [address] Ph: [phone]  For Mammogram screening for breast cancer   Call the [place] below anytime to schedule your own appointment now that order has been placed.  [facility] [place], [place] [address] Phone: [phone]  ------------------ Also consider calling [facility] for Pap Smear update, last one done in 2015, now has been about 3 years, may wait until total 5 years.  Colon Cancer Screening: - For all adults age 55+ routine colon cancer screening is highly recommended.     - Recent guidelines from [place] recommend starting age of 68 - Early detection of colon cancer is important, because often there are no warning signs or symptoms, also if found early usually it can be cured. Late stage is hard to treat.  - If you are not interested in Colonoscopy screening (if done and normal you could be cleared for 5 to 10 years until next due), then Cologuard is an excellent alternative for screening test for Colon Cancer. It is highly sensitive for detecting DNA of colon cancer from even the earliest stages. Also, there is NO bowel prep required. - If Cologuard is NEGATIVE, then it is good for 3 years before next due - If Cologuard is POSITIVE, then it is strongly advised to get a Colonoscopy, which allows the GI doctor to locate the source of the cancer or polyp (even very early stage) and treat it by removing it. ------------------------- If you would like to proceed with Cologuard (stool DNA test) - FIRST, call your insurance company and tell them you  want to check cost of Cologuard tell them CPT Code [id number] (it may be completely covered and you could get for no cost, OR max cost without any coverage is about $600). Also, keep in mind if you do NOT open the kit, and decide not to do the test, you will NOT be charged, you should contact the company if you decide not to do the test. - If you want to proceed, you can notify us (phone message, [place], or at next visit) and we will order it for you. The test kit will be delivered to you house within about 1 week. Follow instructions to collect sample, you may call the company for any help or questions, 24/7 telephone support at [phone].  Please schedule a Follow-up Appointment to: Return in about 6 months (around 07/25/2017) for Weight Check.  If you have any other questions or concerns, please feel free to call the clinic or send a message through [place]. You may also schedule an earlier appointment if necessary.  Additionally, you may be receiving a survey about your experience at our clinic within a few days to 1 week by e-mail or mail. We value your feedback.  Nobie Putnam, DO [facility]

## 2017-01-25 NOTE — Assessment & Plan Note (Signed)
Due for routine colon cancer screening. Never had colonoscopy (not interested), concern with family history colon cancer, father dx late 2170s and passed 1884 - Discussion today about recommendations for either Colonoscopy or Cologuard screening, benefits and risks of screening, interested in Cologuard, understands that if positive then recommendation is for diagnostic colonoscopy to follow-up. - Patient advised to contact insurance first to learn cost, will notify us when ready for us to order Cologuard

## 2017-01-25 NOTE — Assessment & Plan Note (Signed)
Well-controlled Pre-DM with A1c 5.6, unchanged in 1 year, stable but with poor lifestyle Concern with obesity, HLD  Plan:  1. Not on any therapy currently  2. Encourage improved lifestyle - low carb, low sugar diet, reduce portion size, start regular exercise more cardio 3. Follow-up 6 months wt check, consider adding low dose metformin in future if still pre-DM

## 2017-07-05 ENCOUNTER — Ambulatory Visit: Payer: Managed Care, Other (non HMO) | Admitting: Family Medicine

## 2017-07-05 ENCOUNTER — Encounter: Payer: Self-pay | Admitting: Family Medicine

## 2017-07-05 VITALS — BP 138/80 | HR 87 | Temp 98.8°F | Resp 16 | Ht 61.0 in | Wt 226.0 lb

## 2017-07-05 DIAGNOSIS — R002 Palpitations: Secondary | ICD-10-CM

## 2017-07-05 DIAGNOSIS — F419 Anxiety disorder, unspecified: Secondary | ICD-10-CM

## 2017-07-05 MED ORDER — BUSPIRONE HCL 5 MG PO TABS: 5.0000 mg | ORAL_TABLET | Freq: Two times a day (BID) | ORAL | 2 refills | Status: DC | PRN

## 2017-07-05 NOTE — Assessment & Plan Note (Signed)
Suspected to be related to anxiety/stress/mood and possibly caffeine intake Prior work-up cardiac negative in 2016 including stress test Now mild intermittent symptoms Still drinking coffee at night and tea during day high caffeine amount Advised to stop coffee in PM Start Buspar for regulating anxiety symptoms Future consider BB if need for symptoms Follow-up 6 weeks - future may need labs and possible return to cardiology if worsening criteria given

## 2017-07-05 NOTE — Progress Notes (Signed)
Subjective:    Patient ID: Haley Woods, female    DOB: 1960-08-06, 57 y.o.   MRN: [id number]  Haley Woods is a 57 y.o. female presenting on 07/05/2017 for Palpitations (as per patient last week felt palpitation in morning has hx of anxiety could be postmenoposal)   HPI  Palpitations, Intermittent / History of Anxiety and Mood Disorder - Reports symptoms started about 1 week ago with episodic "chest or heart fluttering" seems to notice it more in morning when first waking up, then seems to improve and go away, seems to be brief episodes, no particular trigger for symptoms, she is active with piliates and this does not trigger symptom. Does not notice more palpitations in evening when resting - Prior cardiac work-up in 2016, with some atypical chest pains and anxiety stress, and she had stress test and overall testing was normal, she was started on SSRI Lexapro 10-20mg  but she did not like the way she felt "not caring about anything" but it seemed to help calm her down - Drinks caffeine tea 16 oz during day, and coffee x 2 cups - Exercise with piliates and home exercise ball - Diet: low carb not working - [employer] prior weight gain - Admits significant life stressors and feels anxious often, but likes to treat naturally - She can usually function well still, just got promotion at work, but does less work/active on weekends, states she "retreats within herself" - Only recent change she used a new Essential Oil ("balanace") uses on her chest at times, unsure if this affected her, otherwise using Serenity Oil that has helped her before used it long-term - No known fam history of CAD/MI in earlier life. Mother/grandparents age >11 - Denies any chest pains, chest tightness, dyspnea, sweating, nausea, vomiting  Considered HM topics, did not discuss in detail today due to time.  Depression screen [facility] 2/9 07/05/2017 02/13/2015 12/03/2014  Decreased Interest 1 1 1   Down, Depressed, Hopeless 1 0 0    PHQ - 2 Score 2 1 1   Altered sleeping 1 - -  Tired, decreased energy 2 - -  Change in appetite 1 - -  Feeling bad or failure about yourself  1 - -  Trouble concentrating 0 - -  Moving slowly or fidgety/restless 0 - -  Suicidal thoughts 0 - -  PHQ-9 Score 7 - -  Difficult doing work/chores Somewhat difficult - -   GAD 7 : Generalized Anxiety Score 07/05/2017  Nervous, Anxious, on Edge 1  Control/stop worrying 1  Worry too much - different things 1  Trouble relaxing 1  Restless 1  Easily annoyed or irritable 1  Afraid - awful might happen 0  Total GAD 7 Score 6  Anxiety Difficulty Not difficult at all    Social History   Tobacco Use  . Smoking status: Never Smoker  . Smokeless tobacco: Never Used  Substance Use Topics  . Alcohol use: No    Alcohol/week: 0.0 oz  . Drug use: No    Review of Systems Per HPI unless specifically indicated above     Objective:    BP 138/80   Pulse 87   Temp 98.8 F (37.1 C) (Oral)   Resp 16   Ht 5\' 1"  (1.549 m)   Wt 226 lb (102.5 kg)   LMP 01/17/2016 (Within Weeks)   BMI 42.70 kg/m   Wt Readings from Last 3 Encounters:  07/05/17 226 lb (102.5 kg)  01/25/17 224 lb 9.6 oz (101.9  kg)  02/14/16 216 lb (98 kg)    Physical Exam  Constitutional: She is oriented to person, place, and time. She appears well-developed and well-nourished. No distress.  Well-appearing, comfortable, cooperative, obese  HENT:  Head: Normocephalic and atraumatic.  Mouth/Throat: Oropharynx is clear and moist.  Eyes: Conjunctivae are normal. Right eye exhibits no discharge. Left eye exhibits no discharge.  Neck: Normal range of motion. Neck supple. No thyromegaly present.  Cardiovascular: Normal rate, regular rhythm, normal heart sounds and intact distal pulses.  No murmur heard. Pulmonary/Chest: Effort normal and breath sounds normal. No respiratory distress. She has no wheezes. She has no rales.  Musculoskeletal: Normal range of motion. She exhibits no  edema.  Lymphadenopathy:    She has no cervical adenopathy.  Neurological: She is alert and oriented to person, place, and time.  Skin: Skin is warm and dry. No rash noted. She is not diaphoretic. No erythema.  Psychiatric: She has a normal mood and affect. Her behavior is normal.  Well groomed, good eye contact, normal speech and thoughts. Mildly anxious appearing but does seem calm in discussion on her concerns today. Good insight into health  Nursing note and vitals reviewed.  Results for orders placed or performed in visit on 01/25/17  Basic metabolic panel  Result Value Ref Range   Glucose 110    Creatinine 0.8 0.5 - 1.1  Lipid panel  Result Value Ref Range   Triglycerides 135 40 - 160   Cholesterol 244 (A) 0 - 200   HDL 60 35 - 70   LDL Cholesterol 157   Hemoglobin A1c  Result Value Ref Range   Hemoglobin A1C 5.6   Basic metabolic panel  Result Value Ref Range   Glucose 102    Creatinine 0.8 0.5 - 1.1  Lipid panel  Result Value Ref Range   Triglycerides 146 40 - 160   Cholesterol 225 (A) 0 - 200   HDL 55 35 - 70   LDL Cholesterol 141   Hemoglobin A1c  Result Value Ref Range   Hemoglobin A1C 5.6       Assessment & Plan:   Problem List Items Addressed This Visit    Anxiety - Primary    Suspected adjustment disorder w/ anxiety mixed depressed mood, difficult to determine underlying primary symptom or cause. Now with gradual worsening but not seem to affect her daily function, got promotion at work. -GAD7: 6 not difficult / PHQ9: 7 somewhat - Failed: Lexapro 10-20mg  (worked but made her feel out of it) - No prior dx / Psych / counseling  Plan: 1. Discussion on ideally need to control mood/anxiety/depression first to help her reported symptoms of palpitations intermittently and other factors 2. Start Buspar 5mg  BID PRN - may use regularly for 1-2 weeks then can reduce to PRN or continue longer, given she does not like taking rx medications 3. Advised recommend  therapy / counseling in future - handout given with self referral info - consider [place] office psychology in house 4. Follow-up 4-6 weeks anxiety, med adjust, GAD7/PHQ9      Relevant Medications   busPIRone (BUSPAR) 5 MG tablet   Intermittent palpitations    Suspected to be related to anxiety/stress/mood and possibly caffeine intake Prior work-up cardiac negative in 2016 including stress test Now mild intermittent symptoms Still drinking coffee at night and tea during day high caffeine amount Advised to stop coffee in PM Start Buspar for regulating anxiety symptoms Future consider BB if need for symptoms Follow-up  6 weeks - future may need labs and possible return to cardiology if worsening criteria given      Morbid obesity (HCC)    Uncontrolled still with some wt gain, mostly poor lifestyle diet/exercise only partially improved. 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 Elevated BP but improved re-check never HTN  Plan: 1. Reviewed wt loss strategies - seems to benefit from diet changes, exercise is limited only piliates at the moment, will try to improve 2. Consider future referral Obesity Clinic Dr Dalbert Garnet 3. Also consider med with Contrave in future - may check cost 4. Concern anxiety/stress driving weight - treat with buspar 5. Follow-up 6 weeks         Meds ordered this encounter  Medications  . busPIRone (BUSPAR) 5 MG tablet    Sig: Take 1 tablet (5 mg total) by mouth 2 (two) times daily as needed (anxiety).    Dispense:  60 tablet    Refill:  2      Follow up plan: Return in about 6 weeks (around 08/16/2017) for Anxiety/Mood PHQ/GAD, med adjust.   Saralyn Pilar, DO [facility] 07/05/2017, 9:44 AM

## 2017-07-05 NOTE — Patient Instructions (Addendum)
Thank you for coming to the office today.  1.  As discussed, it sounds like your symptoms are primarily related to anxiety / adjustment disorder. This is a very common problem and be related to several factors, including life stressors. Start treatment with Buspirone 5mg , take one pill twice daily for next 2-3 weeks. As discussed most anxiety medications are also used for mood disorders such as depression, because they work on similar chemicals in your brain. It may take up to 1-2 weeks for the medicine to take full effect and for you to notice a difference, sometimes you may notice it working sooner, otherwise we may need to adjust the dose.  If it is helping and you dont want to take it longer term, then I would consider reducing to PRN only as needed - take it 1-2 times daily for 1 week at a time when you feel more stressed and need it  For most patients with anxiety or mood concerns, we generally recommend referral to establish with a therapist or counselor as well. This has been shown to improve the effectiveness of the medications, and in the future we may be able to taper off medications.   ---------------------------------------------------------------------------------------------------------  PSYCHOLOGY COUNSELING ONLY  Delight Ovenssman Mostafa, LCSW [address] [place], [place] [id number] [place]: [phone]  ------------------------------------------------------  WEIGHT MANAGEMENT  Dr Quillian Quincearen Beasley  [facility] [address] [place], [place] [address] Ph: [phone]   Please schedule a Follow-up Appointment to: Return in about 6 weeks (around 08/16/2017) for Anxiety/Mood PHQ/GAD, med adjust.    If you have any other questions or concerns, please feel free to call the office or send a message through MyChart. You may also schedule an earlier appointment if necessary.  Additionally, you may be receiving a survey about your  experience at our office within a few days to 1 week by e-mail or mail. We value your feedback.  Saralyn PilarAlexander Ailee Pates, DO [facility]

## 2017-07-05 NOTE — Assessment & Plan Note (Signed)
Uncontrolled still with some wt gain, mostly poor lifestyle diet/exercise only partially improved. 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 Elevated BP but improved re-check never HTN  Plan: 1. Reviewed wt loss strategies - seems to benefit from diet changes, exercise is limited only piliates at the moment, will try to improve 2. Consider future referral Obesity Clinic Dr Dalbert GarnetBeasley 3. Also consider med with Contrave in future - may check cost 4. Concern anxiety/stress driving weight - treat with buspar 5. Follow-up 6 weeks

## 2017-07-05 NOTE — Assessment & Plan Note (Signed)
Suspected adjustment disorder w/ anxiety mixed depressed mood, difficult to determine underlying primary symptom or cause. Now with gradual worsening but not seem to affect her daily function, got promotion at work. -GAD7: 6 not difficult / PHQ9: 7 somewhat - Failed: Lexapro 10-20mg  (worked but made her feel out of it) - No prior dx / Psych / counseling  Plan: 1. Discussion on ideally need to control mood/anxiety/depression first to help her reported symptoms of palpitations intermittently and other factors 2. Start Buspar 5mg  BID PRN - may use regularly for 1-2 weeks then can reduce to PRN or continue longer, given she does not like taking rx medications 3. Advised recommend therapy / counseling in future - handout given with self referral info - consider  office psychology in house 4. Follow-up 4-6 weeks anxiety, med adjust, GAD7/PHQ9

## 2017-08-17 ENCOUNTER — Ambulatory Visit: Payer: Managed Care, Other (non HMO) | Admitting: Family Medicine

## 2017-08-17 ENCOUNTER — Encounter: Payer: Self-pay | Admitting: Family Medicine

## 2017-08-17 VITALS — BP 126/74 | HR 74 | Temp 98.6°F | Resp 16 | Ht 61.0 in | Wt 227.0 lb

## 2017-08-17 DIAGNOSIS — F3341 Major depressive disorder, recurrent, in partial remission: Secondary | ICD-10-CM | POA: Diagnosis not present

## 2017-08-17 DIAGNOSIS — R002 Palpitations: Secondary | ICD-10-CM | POA: Diagnosis not present

## 2017-08-17 DIAGNOSIS — F419 Anxiety disorder, unspecified: Secondary | ICD-10-CM

## 2017-08-17 DIAGNOSIS — J302 Other seasonal allergic rhinitis: Secondary | ICD-10-CM

## 2017-08-17 MED ORDER — MONTELUKAST SODIUM 10 MG PO TABS: 10.0000 mg | ORAL_TABLET | Freq: Every day | ORAL | 3 refills | Status: DC

## 2017-08-17 MED ORDER — FEXOFENADINE HCL 180 MG PO TABS: 180.0000 mg | ORAL_TABLET | Freq: Every day | ORAL | 3 refills | Status: DC

## 2017-08-17 MED ORDER — BUSPIRONE HCL 5 MG PO TABS: 5.0000 mg | ORAL_TABLET | Freq: Two times a day (BID) | ORAL | 5 refills | Status: DC

## 2017-08-17 MED ORDER — FLUTICASONE PROPIONATE 50 MCG/ACT NA SUSP: 2.0000 | Freq: Every day | NASAL | 3 refills | Status: DC

## 2017-08-17 NOTE — Progress Notes (Signed)
Subjective:    Patient ID: Haley Woods, female    DOB: 06-27-1960, 57 y.o.   MRN: [id number]  Haley Woods is a 57 y.o. female presenting on 08/17/2017 for Anxiety (improved with medication) and Depression   HPI    Palpitations, Intermittent / History of Anxiety and Mood Disorder  FOLLOW-UP Anxiety / Mood Disorder - Last visit with me 07/05/17, for same problems, treated with new started Buspar 5mg  BID after she had side effects could not tolerate Levaquin, see prior notes for background information. - Interval update with mostly resolved or improved palpitations or chest fluttering symptoms for while, but then has some recent life stressors still this week, again similarly noticed brief episode of flutter - Also recently had URI bronchitis, improved with Dutera essential oils advice from nurse friend, had lingering cough for up to 4 weeks - Today patient reports significant improvement overall, taking Buspar 5mg  BID every day not missing doses and not intermittent - Additionally she started to improve lifestyle, avoids eating after 6pm - She visited her pastor to help with counseling and support, which has helped, she did not want to pursue psychologist or therapist at this time - Admits some stress with mother's estate still - Denies any chest pains, chest tightness, dyspnea, sweating, nausea, vomiting  Chronic Sinus problems / Seasonal Allergies She used to take Allergy shots, she believed they were helping, she is not sure exactly if had good result. She still had to take medicine regimen in addition. She is taking OTC Claritin. Limited results. - Worse season is Spring. She has been on Xyzal in past and Singulair - Not interested in returning to ENT or Allergy - She requests to resume the Allegra and other allergy meds to help prevent problem from starting - Also allergic to her pet cats - Denies any sinus pain or pressure congestion, ear pain, fever chills, dyspnea wheezing  cough  Health Maintenance: Due for routine pap smear, last 2015 per GYN. She plans to return to them, advised it is due within 3-5 years from last.  Depression screen [facility] 2/9 08/17/2017 07/05/2017 02/13/2015  Decreased Interest 1 1 1   Down, Depressed, Hopeless 1 1 0  PHQ - 2 Score 2 2 1   Altered sleeping 0 1 -  Tired, decreased energy 2 2 -  Change in appetite 1 1 -  Feeling bad or failure about yourself  1 1 -  Trouble concentrating 0 0 -  Moving slowly or fidgety/restless 0 0 -  Suicidal thoughts 0 0 -  PHQ-9 Score 6 7 -  Difficult doing work/chores Not difficult at all Somewhat difficult -   GAD 7 : Generalized Anxiety Score 08/17/2017 07/05/2017  Nervous, Anxious, on Edge 1 1  Control/stop worrying 1 1  Worry too much - different things 1 1  Trouble relaxing 1 1  Restless 0 1  Easily annoyed or irritable 0 1  Afraid - awful might happen 0 0  Total GAD 7 Score 4 6  Anxiety Difficulty Not difficult at all Not difficult at all    Social History   Tobacco Use  . Smoking status: Never Smoker  . Smokeless tobacco: Never Used  Substance Use Topics  . Alcohol use: No    Alcohol/week: 0.0 oz  . Drug use: No    Review of Systems Per HPI unless specifically indicated above     Objective:    BP 126/74   Pulse 74   Temp 98.6 F (37 C) (  Oral)   Resp 16   Ht 5\' 1"  (1.549 m)   Wt 227 lb (103 kg)   LMP 01/17/2016 (Within Weeks)   BMI 42.89 kg/m   Wt Readings from Last 3 Encounters:  08/17/17 227 lb (103 kg)  07/05/17 226 lb (102.5 kg)  01/25/17 224 lb 9.6 oz (101.9 kg)    Physical Exam  Constitutional: She is oriented to person, place, and time. She appears well-developed and well-nourished. No distress.  Well-appearing, comfortable, cooperative, obese  HENT:  Head: Normocephalic and atraumatic.  Mouth/Throat: Oropharynx is clear and moist.  Eyes: Conjunctivae are normal. Right eye exhibits no discharge. Left eye exhibits no discharge.  Neck: Normal range of motion.  Neck supple. No thyromegaly present.  Cardiovascular: Normal rate, regular rhythm, normal heart sounds and intact distal pulses.  No murmur heard. No ectopy  Pulmonary/Chest: Effort normal and breath sounds normal. No respiratory distress. She has no wheezes. She has no rales.  Musculoskeletal: Normal range of motion. She exhibits no edema.  Lymphadenopathy:    She has no cervical adenopathy.  Neurological: She is alert and oriented to person, place, and time.  Skin: Skin is warm and dry. No rash noted. She is not diaphoretic. No erythema.  Psychiatric: She has a normal mood and affect. Her behavior is normal.  Well groomed, good eye contact, normal speech and thoughts. Less anxious appearing today.  Nursing note and vitals reviewed.  Results for orders placed or performed in visit on 01/25/17  Basic metabolic panel  Result Value Ref Range   Glucose 110    Creatinine 0.8 0.5 - 1.1  Lipid panel  Result Value Ref Range   Triglycerides 135 40 - 160   Cholesterol 244 (A) 0 - 200   HDL 60 35 - 70   LDL Cholesterol 157   Hemoglobin A1c  Result Value Ref Range   Hemoglobin A1C 5.6   Basic metabolic panel  Result Value Ref Range   Glucose 102    Creatinine 0.8 0.5 - 1.1  Lipid panel  Result Value Ref Range   Triglycerides 146 40 - 160   Cholesterol 225 (A) 0 - 200   HDL 55 35 - 70   LDL Cholesterol 141   Hemoglobin A1c  Result Value Ref Range   Hemoglobin A1C 5.6       Assessment & Plan:   Problem List Items Addressed This Visit    Allergic rhinitis, seasonal    Significant chronic mostly year-round allergies Currently on minimal treatment, no longer on allergy shots per Allergist/ENT, not plan to return question effective  Plan Restart Allegra 180mg  daily (sent rx, may use OTC instead if not covered), d/t limited result on claritin Rx Singulair 10mg  nightly (previously on in past, improvement) Refill Flonase - use regularly as prescribed May return to ENT/Allergy if  prefer, follow-up      Relevant Medications   fexofenadine (ALLEGRA) 180 MG tablet   montelukast (SINGULAIR) 10 MG tablet   fluticasone (FLONASE) 50 MCG/ACT nasal spray   Anxiety - Primary    Significant improvement on Buspar for anxiety control now. Some life stressors still but improving Some mixed mood disorder GAD/PHQ improved - Failed: Lexapro 10-20mg  (worked but made her feel out of it) - No prior dx / Psych / counseling  Plan: 1. Continue Buspar 5mg  BID (re-ordered) - continue daily use for now in future may titrate dose up or down or switch to PRN, notify office if need change 2. Advised recommend  therapy / counseling in future - handout given with self referral info - consider [facility] office psychology if need. Otherwise may continue with her pastor for now 3. Follow-up in 6 months for yearly, repeat PHQ/GAD med adjust      Relevant Medications   busPIRone (BUSPAR) 5 MG tablet   Depression    Secondary mood disorder seems to anxiety Improved mood now with anxiety control on buspar See A&P anxiety Follow-up      Relevant Medications   busPIRone (BUSPAR) 5 MG tablet   Intermittent palpitations    Mostly resolved now on better anxiety control on buspar and reduced triggers Still present with some lifestyle stress         Meds ordered this encounter  Medications  . fexofenadine (ALLEGRA) 180 MG tablet    Sig: Take 1 tablet (180 mg total) by mouth daily.    Dispense:  90 tablet    Refill:  3  . montelukast (SINGULAIR) 10 MG tablet    Sig: Take 1 tablet (10 mg total) by mouth at bedtime.    Dispense:  90 tablet    Refill:  3  . fluticasone (FLONASE) 50 MCG/ACT nasal spray    Sig: Place 2 sprays into both nostrils daily.    Dispense:  48 g    Refill:  3  . busPIRone (BUSPAR) 5 MG tablet    Sig: Take 1 tablet (5 mg total) by mouth 2 (two) times daily.    Dispense:  60 tablet    Refill:  5     Follow up plan: Return in about 6 months (around 02/16/2018) for  Annual Physical (review [facility] results).  She is [facility] employee and will get anticipated biometric labs from [facility] in approx 4-6 months, we will add other [facility] orders to this if needed.  Saralyn Pilar, DO [facility] 08/17/2017, 9:22 AM

## 2017-08-17 NOTE — Assessment & Plan Note (Signed)
Secondary mood disorder seems to anxiety Improved mood now with anxiety control on buspar See A&P anxiety Follow-up

## 2017-08-17 NOTE — Assessment & Plan Note (Signed)
Significant improvement on Buspar for anxiety control now. Some life stressors still but improving Some mixed mood disorder GAD/PHQ improved - Failed: Lexapro 10-20mg  (worked but made her feel out of it) - No prior dx / Psych / counseling  Plan: 1. Continue Buspar 5mg  BID (re-ordered) - continue daily use for now in future may titrate dose up or down or switch to PRN, notify office if need change 2. Advised recommend therapy / counseling in future - handout given with self referral info - consider [facility] if need. Otherwise may continue with her pastor for now 3. Follow-up in 6 months for yearly, repeat PHQ/GAD med adjust

## 2017-08-17 NOTE — Assessment & Plan Note (Signed)
Significant chronic mostly year-round allergies Currently on minimal treatment, no longer on allergy shots per Allergist/ENT, not plan to return question effective  Plan Restart Allegra 180mg  daily (sent rx, may use OTC instead if not covered), d/t limited result on claritin Rx Singulair 10mg  nightly (previously on in past, improvement) Refill Flonase - use regularly as prescribed May return to ENT/Allergy if prefer, follow-up

## 2017-08-17 NOTE — Assessment & Plan Note (Signed)
Mostly resolved now on better anxiety control on buspar and reduced triggers Still present with some lifestyle stress

## 2017-08-17 NOTE — Patient Instructions (Addendum)
Thank you for coming to the office today.  Keep up good work overall, continue Buspar, sent refills  For allergies - take generic allegra or name brand 180mg  daily, added rx Singulair daily, and refilled Flonase  If interested to return to allergy or ENT let me know  Recommend to schedule pap smear and check-up with GYN, last was done in 2015. Due every 3 to 5 years.  Go ahead and get biometric routine labs from [facility] usually in August or Fall 2019.  Once you get these I can review them and then we can add to it if need BEFORE or AFTER your physical appointment  WILL DUE for FASTING BLOOD WORK (no food or drink after midnight before the lab appointment, only water or coffee without cream/sugar on the morning of)  For Lab Results, once available within 2-3 days of blood draw, you can can log in to MyChart online to view your results and a brief explanation. Also, we can discuss results at next follow-up visit.   Please schedule a Follow-up Appointment to: Return in about 6 months (around 02/16/2018) for Annual Physical (review [facility] results).  If you have any other questions or concerns, please feel free to call the office or send a message through MyChart. You may also schedule an earlier appointment if necessary.  Additionally, you may be receiving a survey about your experience at our office within a few days to 1 week by e-mail or mail. We value your feedback.  Saralyn PilarAlexander Valynn Schamberger, DO [facility]

## 2017-09-20 ENCOUNTER — Telehealth: Payer: Self-pay | Admitting: Family Medicine

## 2017-09-20 DIAGNOSIS — F419 Anxiety disorder, unspecified: Secondary | ICD-10-CM

## 2017-09-20 MED ORDER — BUSPIRONE HCL 5 MG PO TABS: 5.0000 mg | ORAL_TABLET | Freq: Two times a day (BID) | ORAL | 1 refills | Status: DC

## 2017-09-20 NOTE — Telephone Encounter (Signed)
Pt. Called states that she have taken more of her buspirone refill will not be ready until Wednesday, but have a family member in hospice that may pass anytime if pt have to leave before Wednesday she will not have enough  Medication (she states that they will be gone for at lease a week) Pt.call back # is [phone]

## 2017-09-20 NOTE — Telephone Encounter (Signed)
Called patient, she may be leaving out of town to [place] due to ill family member tomorrow. She is asking about her Buspar, she was doing well on it in past only taking one twice daily and then now she had to increase recently back to 2 pills TWICE daily and ran out. She is due for refill early, last filled 08/17/17 60 pills BID but had 5 refills, they cannot fill until 09/22/17. She cannot wait for this. I advised her she may purchase some cash pay, or we can change instruction  I have sent new rx instructions changed for inc dose now 1-2 pills BID, #120 +1 refill - may take higher dose as instructed temporarily, new rx to pharmacy.  In future we can reduce dose back to 1 tab BID  Saralyn Pilar, DO [facility] 09/20/2017, 12:25 PM

## 2017-10-01 ENCOUNTER — Other Ambulatory Visit: Payer: Self-pay | Admitting: Family Medicine

## 2017-10-01 DIAGNOSIS — F419 Anxiety disorder, unspecified: Secondary | ICD-10-CM

## 2017-11-27 ENCOUNTER — Other Ambulatory Visit: Payer: Self-pay | Admitting: Family Medicine

## 2017-11-27 DIAGNOSIS — F419 Anxiety disorder, unspecified: Secondary | ICD-10-CM

## 2017-12-25 LAB — BASIC METABOLIC PANEL
Creatinine: 0.9 (ref 0.5–1.1)
Glucose: 103

## 2017-12-25 LAB — MEASLES/MUMPS/RUBELLA IMMUNITY
MUMPS ABS, IGG: >300 (ref 10.9–?)
RUBEOLA AB, IGG: 59.2 (ref 29.9–?)
Rubella Antibodies, IGG: 27.4 (ref 0.99–?)

## 2017-12-25 LAB — LIPID PANEL
CHOLESTEROL: 227 — AB (ref 0–200)
HDL: 54 (ref 35–70)
LDL CALC: 154
TRIGLYCERIDES: 97 (ref 40–160)

## 2017-12-25 LAB — HEMOGLOBIN A1C: Hemoglobin A1C: 5.6

## 2018-01-26 ENCOUNTER — Encounter: Payer: Self-pay | Admitting: Family Medicine

## 2018-01-26 ENCOUNTER — Ambulatory Visit (INDEPENDENT_AMBULATORY_CARE_PROVIDER_SITE_OTHER): Payer: Managed Care, Other (non HMO) | Admitting: Family Medicine

## 2018-01-26 VITALS — BP 132/66 | HR 56 | Temp 98.4°F | Resp 16 | Ht 61.0 in | Wt 215.0 lb

## 2018-01-26 DIAGNOSIS — F3341 Major depressive disorder, recurrent, in partial remission: Secondary | ICD-10-CM

## 2018-01-26 DIAGNOSIS — Z1239 Encounter for other screening for malignant neoplasm of breast: Secondary | ICD-10-CM

## 2018-01-26 DIAGNOSIS — R7303 Prediabetes: Secondary | ICD-10-CM | POA: Diagnosis not present

## 2018-01-26 DIAGNOSIS — Z1211 Encounter for screening for malignant neoplasm of colon: Secondary | ICD-10-CM

## 2018-01-26 DIAGNOSIS — E7800 Pure hypercholesterolemia, unspecified: Secondary | ICD-10-CM | POA: Diagnosis not present

## 2018-01-26 DIAGNOSIS — F419 Anxiety disorder, unspecified: Secondary | ICD-10-CM

## 2018-01-26 DIAGNOSIS — Z0000 Encounter for general adult medical examination without abnormal findings: Secondary | ICD-10-CM

## 2018-01-26 DIAGNOSIS — Z1159 Encounter for screening for other viral diseases: Secondary | ICD-10-CM

## 2018-01-26 DIAGNOSIS — Z1231 Encounter for screening mammogram for malignant neoplasm of breast: Secondary | ICD-10-CM

## 2018-01-26 DIAGNOSIS — Z6841 Body Mass Index (BMI) 40.0 and over, adult: Secondary | ICD-10-CM

## 2018-01-26 DIAGNOSIS — R6889 Other general symptoms and signs: Secondary | ICD-10-CM

## 2018-01-26 NOTE — Assessment & Plan Note (Signed)
Stable A1c 5.6, similar to prior readings 5.6 to 5.7, below PreDM range Still concern with morbid obesity, possibly elevated BP and HLD  Plan:  1. Not on any therapy currently  2. Encourage improved lifestyle - low carb, low sugar diet, reduce portion size, continue improving regular exercise 3. Follow-up 6 months - consider A1c, otherwise continue yearly

## 2018-01-26 NOTE — Assessment & Plan Note (Signed)
Uncontrolled cholesterol elevated LDL, otherwise appropriate HDL Last lipid panel 12/2017 - [facility] work biometric, entered Calculated ASCVD 10 yr risk score 2.3% approx  Plan: 1. Not indicated for statin at this time 2. Suspect some elevated cholesterol from keto diet - but goal for wt loss to continue diet changes 3. Improve regular exercise cardio Yearly lipids biometric

## 2018-01-26 NOTE — Progress Notes (Signed)
Subjective:    Patient ID: Haley Woods, female    DOB: September 04, 1960, 57 y.o.   MRN: [id number]  Haley Woods is a 57 y.o. female presenting on 01/26/2018 for Annual Exam   HPI   Here for Annual Physical and [facility] Biometric Lab Review / BMI Appeal  MORBID OBESITY BMI >40 Improved wt loss down 12+ lbs in 3-4 months with lifestyle improvements. Has lost a weight size, she still tries to lose weight. - Never on medication for weight loss. Has not seen nutritionist Lifestyle: - Diet: Continues to limit carb / sugars, now on keto diet mostly. Drinks more water - Exiercise: Does some pilates, not as much walking recently, plan to inc walking now - Fam history of thyroid problem in father, older age. Her last TSH normal 2015. Interested to re-check lab. - No prior history of sleep apnea, never had PSG, denies snoring or apnea events  HYPERLIPIDEMIA: - Last lipids biometrics, 12/2017 Still elevated LDL and total cholesterol Not on cholesterol medication.  Pre-Diabetes Prior elevated A1c 5.6 to 5.7 in past. Recent lab result A1c 5.6 Never on meds for glucose. See diet above.  History of Elevated BP Reading Prior history of elevated reading >130-160 with electronic cuff that caused her "pain" and said cuff was too tight. Today BP is normal. She is not checking BP at home. Never diagnosed with HTN.  Recurrent Depression, partial remission Prior history in past with mood and depression, now it has overall improved. Seems anxiety is her primary concern. States her mood is mostly in remission, see scores below. She has several life stressors and family stressors affecting her now. Still handling aftermath of her mother's passing. - Taking Buspar 5mg  BID, doing well - Not on anti depressant  Health Maintenance: - Cervical CA Screening: Last pap smear done in 02/2014, due anytime, unsure of result with HPV co testing, but reported "normal", she has [facility] - she did not return  in past 1 year, but again understands she would need to return to them for next pap smear  - Due for routine Hep C screening, will check with labs  - Colon CA Screening: Never had colonoscopy or other screening. Family history father passed away from colon cancer dx 38, passed 75. Agrees to have cologuard ordered and she will call and check on status of it.  - Breast CA Screening Overdue for screening, she did not schedule it within past 1 yr, last mammogram [facility] found abnormal scar tissue, then repeat images cost $ and she was concerned  about this again.  Due Flu shot, declines despite counseling but may get at work.  Depression screen [facility] 2/9 01/26/2018 08/17/2017 07/05/2017  Decreased Interest 1 1 1   Down, Depressed, Hopeless 1 1 1   PHQ - 2 Score 2 2 2   Altered sleeping 0 0 1  Tired, decreased energy 1 2 2   Change in appetite 0 1 1  Feeling bad or failure about yourself  0 1 1  Trouble concentrating 0 0 0  Moving slowly or fidgety/restless 0 0 0  Suicidal thoughts 0 0 0  PHQ-9 Score 3 6 7   Difficult doing work/chores Not difficult at all Not difficult at all Somewhat difficult   GAD 7 : Generalized Anxiety Score 01/26/2018 08/17/2017 07/05/2017  Nervous, Anxious, on Edge 1 1 1   Control/stop worrying 1 1 1   Worry too much - different things 1 1 1   Trouble relaxing 0 1 1  Restless  1 0 1  Easily annoyed or irritable 0 0 1  Afraid - awful might happen 1 0 0  Total GAD 7 Score 5 4 6   Anxiety Difficulty Not difficult at all Not difficult at all Not difficult at all    Past Medical History:  Diagnosis Date  . Allergic rhinitis   . Anxiety   . Back pain   . Pre-diabetes    Past Surgical History:  Procedure Laterality Date  . CHOLECYSTECTOMY  1999   Social History   Socioeconomic History  . Marital status: Married    Spouse name: Dwayne  . Number of children: 1  . Years of education: Not on file  . Highest education level: Not on file  Occupational History     Employer: [employer]  Social Needs  . Financial resource strain: Not on file  . Food insecurity:    Worry: Not on file    Inability: Not on file  . Transportation needs:    Medical: Not on file    Non-medical: Not on file  Tobacco Use  . Smoking status: Never Smoker  . Smokeless tobacco: Never Used  Substance and Sexual Activity  . Alcohol use: No    Alcohol/week: 0.0 standard drinks  . Drug use: No  . Sexual activity: Not on file  Lifestyle  . Physical activity:    Days per week: Not on file    Minutes per session: Not on file  . Stress: Not on file  Relationships  . Social connections:    Talks on phone: Not on file    Gets together: Not on file    Attends religious service: Not on file    Active member of club or organization: Not on file    Attends meetings of clubs or organizations: Not on file    Relationship status: Not on file  . Intimate partner violence:    Fear of current or ex partner: Not on file    Emotionally abused: Not on file    Physically abused: Not on file    Forced sexual activity: Not on file  Other Topics Concern  . Not on file  Social History Narrative  . Not on file   Family History  Problem Relation Age of Onset  . Heart disease Mother   . Congestive Heart Failure Mother   . Colon cancer Father 41       passed recurrence age 44   Current Outpatient Medications on File Prior to Visit  Medication Sig  . busPIRone (BUSPAR) 5 MG tablet Take 1 tablet (5 mg total) by mouth 2 (two) times daily.  . fexofenadine (ALLEGRA) 180 MG tablet Take 1 tablet (180 mg total) by mouth daily.  . fluticasone (FLONASE) 50 MCG/ACT nasal spray Place 2 sprays into both nostrils daily.  . montelukast (SINGULAIR) 10 MG tablet Take 1 tablet (10 mg total) by mouth at bedtime.  . Multiple Vitamin (MULTIVITAMIN) tablet Take 1 tablet by mouth daily.  . naproxen (NAPROSYN) 500 MG tablet Take 500 mg by mouth 2 (two) times daily as needed.   No current facility-administered  medications on file prior to visit.     Review of Systems  Constitutional: Negative for activity change, appetite change, chills, diaphoresis, fatigue and fever.  HENT: Negative for congestion and hearing loss.   Eyes: Negative for visual disturbance.  Respiratory: Negative for apnea, cough, choking, chest tightness, shortness of breath and wheezing.   Cardiovascular: Negative for chest pain, palpitations and leg swelling.  Gastrointestinal: Negative for abdominal pain, anal bleeding, blood in stool, constipation, diarrhea, nausea and vomiting.  Endocrine: Negative for cold intolerance.  Genitourinary: Negative for difficulty urinating, dysuria, frequency and hematuria.  Musculoskeletal: Negative for arthralgias, back pain and neck pain.  Skin: Negative for rash.  Allergic/Immunologic: Negative for environmental allergies.  Neurological: Negative for dizziness, weakness, light-headedness, numbness and headaches.  Hematological: Negative for adenopathy.  Psychiatric/Behavioral: Negative for behavioral problems, dysphoric mood (Improved) and sleep disturbance. The patient is not nervous/anxious (Improved).    Per HPI unless specifically indicated above     Objective:    BP 132/66   Pulse (!) 56   Temp 98.4 F (36.9 C) (Oral)   Resp 16   Ht 5\' 1"  (1.549 m)   Wt 215 lb (97.5 kg)   LMP 01/17/2016 (Within Weeks)   BMI 40.62 kg/m   Wt Readings from Last 3 Encounters:  01/26/18 215 lb (97.5 kg)  08/17/17 227 lb (103 kg)  07/05/17 226 lb (102.5 kg)    Physical Exam  Constitutional: She is oriented to person, place, and time. She appears well-developed and well-nourished. No distress.  Well-appearing, comfortable, cooperative, morbid obesity  HENT:  Head: Normocephalic and atraumatic.  Mouth/Throat: Oropharynx is clear and moist.  Eyes: Pupils are equal, round, and reactive to light. Conjunctivae and EOM are normal. Right eye exhibits no discharge. Left eye exhibits no discharge.    Neck: Normal range of motion. Neck supple. No thyromegaly present.  Cardiovascular: Normal rate, regular rhythm, normal heart sounds and intact distal pulses.  No murmur heard. Pulmonary/Chest: Effort normal and breath sounds normal. No respiratory distress. She has no wheezes. She has no rales.  Abdominal: Soft. Bowel sounds are normal. She exhibits no distension and no mass. There is no tenderness.  Musculoskeletal: Normal range of motion. She exhibits no edema or tenderness.  Upper / Lower Extremities: - Normal muscle tone, strength bilateral upper extremities 5/5, lower extremities 5/5  Lymphadenopathy:    She has no cervical adenopathy.  Neurological: She is alert and oriented to person, place, and time.  Distal sensation intact to light touch all extremities  Skin: Skin is warm and dry. No rash noted. She is not diaphoretic. No erythema.  Psychiatric: She has a normal mood and affect. Her behavior is normal.  Well groomed, good eye contact, normal speech and thoughts  Nursing note and vitals reviewed.  Results for orders placed or performed in visit on 01/26/18  Basic metabolic panel  Result Value Ref Range   Glucose 103    Creatinine 0.9 0.5 - 1.1  Lipid panel  Result Value Ref Range   Triglycerides 97 40 - 160   Cholesterol 227 (A) 0 - 200   HDL 54 35 - 70   LDL Cholesterol 154   Hemoglobin A1c  Result Value Ref Range   Hemoglobin A1C 5.6   Measles/Mumps/Rubella Immunity  Result Value Ref Range   Rubella Antibodies, IGG 27.40 0.99   RUBEOLA AB, IGG 59.2 29.9   MUMPS ABS, IGG >300 10.9      Assessment & Plan:   Problem List Items Addressed This Visit    Anxiety    Remains significant improvement on Buspar for anxiety control now. Some life stressors still but improving Some mixed mood disorder GAD/PHQ improved - Failed: Lexapro 10-20mg  (worked but made her feel out of it) - No prior dx / Psych / counseling  Plan: 1. Continue Buspar 5mg  BID- continue daily use  for now in future  may titrate dose up or down or switch to PRN, notify office if need change 2. Future may benefit from counseling/therapist Follow-up in 6 months      Hyperlipidemia    Uncontrolled cholesterol elevated LDL, otherwise appropriate HDL Last lipid panel 12/2017 - [facility] work biometric, entered Calculated ASCVD 10 yr risk score 2.3% approx  Plan: 1. Not indicated for statin at this time 2. Suspect some elevated cholesterol from keto diet - but goal for wt loss to continue diet changes 3. Improve regular exercise cardio Yearly lipids biometric      Morbid obesity with BMI of 40.0-44.9, adult (HCC)    Improved wt loss now 12 lb in 3-4 months, improved diet lifestyle Needs to improve exercise. Not on meds - Concern Pre-DM, HLD. Elevated BP but improved re-check never HTN  Plan: 1. Continue improving Keto diet and lifestyle changes for wt loss - Increase regular exercise walking - Completed employee BMI exception form, returned today - Follow-up within 6 months, wt check - reconsider other options, nutrition, [facility], other med rx      Relevant Orders   TSH   T4, free   Comprehensive metabolic panel   CBC with Differential/Platelet   Pre-diabetes    Stable A1c 5.6, similar to prior readings 5.6 to 5.7, below PreDM range Still concern with morbid obesity, possibly elevated BP and HLD  Plan:  1. Not on any therapy currently  2. Encourage improved lifestyle - low carb, low sugar diet, reduce portion size, continue improving regular exercise 3. Follow-up 6 months - consider A1c, otherwise continue yearly       Recurrent major depression in partial remission (HCC)    Secondary mood disorder seems to anxiety Now mostly in remission Controlled anxiety on buspar See A&P anxiety Follow-up       Other Visit Diagnoses    Annual physical exam    -  Primary  Updated Health Maintenance information - Overdue on a lot of screening, Ordered mammogram,  recommended return to GYN for pap, ordered cologuard, Hep C lab Reviewed recent lab results with patient Encouraged improvement to lifestyle with diet and exercise - Goal of weight loss     Abnormal weight       Relevant Orders   TSH   T4, free   Need for hepatitis C screening test       Relevant Orders   Hepatitis C antibody   Screening for breast cancer       Relevant Orders   MM DIGITAL SCREENING BILATERAL   Screen for colon cancer      Due for routine colon cancer screening. Never had colonoscopy (not interested), family history colon cancer at older age - Discussion today about recommendations for either Colonoscopy or Cologuard screening, benefits and risks of screening, interested in Cologuard, understands that if positive then recommendation is for diagnostic colonoscopy to follow-up. - Ordered Cologuard today - she was advised to contact insurance first to learn cost,     Relevant Orders   Cologuard      No orders of the defined types were placed in this encounter.    Follow up plan: Return in about 6 months (around 07/27/2018) for 6 mo f/u Anxiety, BP Weight check.  Saralyn Pilar, DO [facility] 01/26/2018, 10:28 AM

## 2018-01-26 NOTE — Patient Instructions (Addendum)
Thank you for coming to the office today.  Recommend scheduling with [facility] for next routine Pap Smear with HPV testing - last done in 2015.  For Mammogram screening for breast cancer   Call the [place] below anytime to schedule your own appointment now that order has been placed.  [facility] [place], [place] [address] Phone: [phone]  Fasting labs ordered - get drawn at [facility] when ready.  Colon Cancer Screening: - For all adults age 70+ routine colon cancer screening is highly recommended.     - Recent guidelines from [place] recommend starting age of 58 - Early detection of colon cancer is important, because often there are no warning signs or symptoms, also if found early usually it can be cured. Late stage is hard to treat.  - If you are not interested in Colonoscopy screening (if done and normal you could be cleared for 5 to 10 years until next due), then Cologuard is an excellent alternative for screening test for Colon Cancer. It is highly sensitive for detecting DNA of colon cancer from even the earliest stages. Also, there is NO bowel prep required. - If Cologuard is NEGATIVE, then it is good for 3 years before next due - If Cologuard is POSITIVE, then it is strongly advised to get a Colonoscopy, which allows the GI doctor to locate the source of the cancer or polyp (even very early stage) and treat it by removing it. ------------------------- If you would like to proceed with Cologuard (stool DNA test) - FIRST, call your insurance company and tell them you want to check cost of Cologuard tell them CPT Code [phone] (it may be completely covered and you could get for no cost, OR max cost without any coverage is about $600). Also, keep in mind if you do NOT open the kit, and decide not to do the test, you will NOT be charged, you should contact the company if you decide not to do  the test. - If you want to proceed, you can notify us (phone message, [place], or at next visit) and we will order it for you. The test kit will be delivered to you house within about 1 week. Follow instructions to collect sample, you may call the company for any help or questions, 24/7 telephone support at [phone].   Please schedule a Follow-up Appointment to: Return in about 6 months (around 07/27/2018) for 6 mo f/u Anxiety, BP Weight check.  If you have any other questions or concerns, please feel free to call the office or send a message through [place]. You may also schedule an earlier appointment if necessary.  Additionally, you may be receiving a survey about your experience at our office within a few days to 1 week by e-mail or mail. We value your feedback.  Nobie Putnam, DO [facility]

## 2018-01-26 NOTE — Assessment & Plan Note (Signed)
Secondary mood disorder seems to anxiety Now mostly in remission Controlled anxiety on buspar See A&P anxiety Follow-up

## 2018-01-26 NOTE — Assessment & Plan Note (Signed)
Remains significant improvement on Buspar for anxiety control now. Some life stressors still but improving Some mixed mood disorder GAD/PHQ improved - Failed: Lexapro 10-20mg  (worked but made her feel out of it) - No prior dx / Psych / counseling  Plan: 1. Continue Buspar 5mg  BID- continue daily use for now in future may titrate dose up or down or switch to PRN, notify office if need change 2. Future may benefit from counseling/therapist Follow-up in 6 months

## 2018-01-26 NOTE — Assessment & Plan Note (Signed)
Improved wt loss now 12 lb in 3-4 months, improved diet lifestyle Needs to improve exercise. Not on meds - Concern Pre-DM, HLD. Elevated BP but improved re-check never HTN  Plan: 1. Continue improving Keto diet and lifestyle changes for wt loss - Increase regular exercise walking - Completed employee BMI exception form, returned today - Follow-up within 6 months, wt check - reconsider other options, nutrition, [facility], other med rx

## 2018-02-21 ENCOUNTER — Encounter: Payer: Managed Care, Other (non HMO) | Admitting: Family Medicine

## 2018-04-27 ENCOUNTER — Encounter: Payer: Self-pay | Admitting: Obstetrics and Gynecology

## 2018-04-27 ENCOUNTER — Ambulatory Visit (INDEPENDENT_AMBULATORY_CARE_PROVIDER_SITE_OTHER): Payer: Managed Care, Other (non HMO)

## 2018-04-27 ENCOUNTER — Ambulatory Visit (INDEPENDENT_AMBULATORY_CARE_PROVIDER_SITE_OTHER): Payer: Managed Care, Other (non HMO) | Admitting: Obstetrics and Gynecology

## 2018-04-27 VITALS — BP 132/80 | HR 77 | Ht 61.0 in | Wt 213.0 lb

## 2018-04-27 DIAGNOSIS — Z1151 Encounter for screening for human papillomavirus (HPV): Secondary | ICD-10-CM

## 2018-04-27 DIAGNOSIS — R9389 Abnormal findings on diagnostic imaging of other specified body structures: Secondary | ICD-10-CM | POA: Diagnosis not present

## 2018-04-27 DIAGNOSIS — N950 Postmenopausal bleeding: Secondary | ICD-10-CM

## 2018-04-27 DIAGNOSIS — Z124 Encounter for screening for malignant neoplasm of cervix: Secondary | ICD-10-CM | POA: Diagnosis not present

## 2018-04-27 DIAGNOSIS — Z1239 Encounter for other screening for malignant neoplasm of breast: Secondary | ICD-10-CM

## 2018-04-27 NOTE — Progress Notes (Signed)
Smitty Cords, DO   Chief Complaint  Patient presents with  . Abnormal bleeding    had spotting that lasted 3 days, no pelvic pain  . [employer] Employee    HPI:      Ms. Haley Woods is a 57 y.o. G1P0010 who LMP was Patient's last menstrual period was 01/17/2016 (within weeks)., presents today as NP> 3 yrs for PMB eval. Pt's last menses about age 57. No bleeding/spotting since. Had light red/pink bleeding with wiping only for about 3 days earlier this wk. Sx have resolved. No blood in underwear. No dysmen. No urin sx, occas constipation recently. Pt is sex active with husband, no postcoital bleeding/dyspareunia but may have had sex in close proximity to sx start. Pt with night sweats.  No FH breast, ovar, uterine ca. Last pap 08/29/13 was neg cells/neg HPV DNA.  Pt hasn't had recent annual or mammo.  Has physicals/labs with PCP.  Past Medical History:  Diagnosis Date  . Allergic rhinitis   . Anxiety   . Back pain   . Pre-diabetes     Past Surgical History:  Procedure Laterality Date  . CHOLECYSTECTOMY  1999  . DIAGNOSTIC LAPAROSCOPY  1998   with hydrotubation  . DILATION AND CURETTAGE OF UTERUS     1998/1999    Family History  Problem Relation Age of Onset  . Heart disease Mother   . Congestive Heart Failure Mother   . Colon cancer Father 32       passed recurrence age 37  . Thyroid disease Father   . Diabetes Brother        type 2  . Heart disease Brother   . Heart disease Maternal Grandmother   . Heart disease Maternal Grandfather   . Diabetes Paternal Grandmother        type 2  . Diabetes Paternal Aunt        type 2  . Breast cancer Neg Hx   . Ovarian cancer Neg Hx     Social History   Socioeconomic History  . Marital status: Married    Spouse name: Dwayne  . Number of children: 1  . Years of education: Not on file  . Highest education level: Not on file  Occupational History    Employer: [employer]  Social Needs  . Financial resource  strain: Not on file  . Food insecurity:    Worry: Not on file    Inability: Not on file  . Transportation needs:    Medical: Not on file    Non-medical: Not on file  Tobacco Use  . Smoking status: Never Smoker  . Smokeless tobacco: Never Used  Substance and Sexual Activity  . Alcohol use: No    Alcohol/week: 0.0 standard drinks  . Drug use: No  . Sexual activity: Yes    Birth control/protection: Post-menopausal  Lifestyle  . Physical activity:    Days per week: Not on file    Minutes per session: Not on file  . Stress: Not on file  Relationships  . Social connections:    Talks on phone: Not on file    Gets together: Not on file    Attends religious service: Not on file    Active member of club or organization: Not on file    Attends meetings of clubs or organizations: Not on file    Relationship status: Not on file  . Intimate partner violence:    Fear of current or ex partner: Not  on file    Emotionally abused: Not on file    Physically abused: Not on file    Forced sexual activity: Not on file  Other Topics Concern  . Not on file  Social History Narrative  . Not on file    Outpatient Medications Prior to Visit  Medication Sig Dispense Refill  . busPIRone (BUSPAR) 5 MG tablet Take 1 tablet (5 mg total) by mouth 2 (two) times daily. 180 tablet 1  . fexofenadine (ALLEGRA) 180 MG tablet Take 1 tablet (180 mg total) by mouth daily. 90 tablet 3  . fluticasone (FLONASE) 50 MCG/ACT nasal spray Place 2 sprays into both nostrils daily. 48 g 3  . montelukast (SINGULAIR) 10 MG tablet Take 1 tablet (10 mg total) by mouth at bedtime. 90 tablet 3  . Multiple Vitamin (MULTIVITAMIN) tablet Take 1 tablet by mouth daily.    . naproxen (NAPROSYN) 500 MG tablet Take 500 mg by mouth 2 (two) times daily as needed.     No facility-administered medications prior to visit.       ROS:  Review of Systems  Constitutional: Negative for fatigue, fever and unexpected weight change.    Respiratory: Negative for cough, shortness of breath and wheezing.   Cardiovascular: Negative for chest pain, palpitations and leg swelling.  Gastrointestinal: Negative for blood in stool, constipation, diarrhea, nausea and vomiting.  Endocrine: Negative for cold intolerance, heat intolerance and polyuria.  Genitourinary: Positive for vaginal bleeding. Negative for dyspareunia, dysuria, flank pain, frequency, genital sores, hematuria, menstrual problem, pelvic pain, urgency, vaginal discharge and vaginal pain.  Musculoskeletal: Negative for back pain, joint swelling and myalgias.  Skin: Negative for rash.  Neurological: Negative for dizziness, syncope, light-headedness, numbness and headaches.  Hematological: Negative for adenopathy.  Psychiatric/Behavioral: Negative for agitation, confusion, sleep disturbance and suicidal ideas. The patient is not nervous/anxious.    BREAST: No symptoms   OBJECTIVE:   Vitals:  BP 132/80   Pulse 77   Ht 5\' 1"  (1.549 m)   Wt 213 lb (96.6 kg)   LMP 01/17/2016 (Within Weeks)   BMI 40.25 kg/m   Physical Exam  Constitutional: She is oriented to person, place, and time. Vital signs are normal. She appears well-developed.  Pulmonary/Chest: Effort normal.  Genitourinary: Vagina normal and uterus normal. There is no rash, tenderness or lesion on the right labia. There is no rash, tenderness or lesion on the left labia. Uterus is not enlarged and not tender. Cervix exhibits no motion tenderness. Right adnexum displays no mass and no tenderness. Left adnexum displays no mass and no tenderness. No erythema or tenderness in the vagina. No vaginal discharge found.  Musculoskeletal: Normal range of motion.  Neurological: She is alert and oriented to person, place, and time.  Psychiatric: She has a normal mood and affect. Her behavior is normal. Thought content normal.  Vitals reviewed.   Results:  Patient Name: Haley Woods DOB: 18-Jun-1960 MRN:  [id number] ULTRASOUND REPORT  Location: [facility]  Date of Service: 04/27/2018    Indications: Postmenopausal bleeding Findings:  The uterus is anteverted and measures 5.6 x 3.2 x 2.4cm. Echo texture is homogenous without evidence of focal masses.  The Endometrium is heterogeneous, vascular and thickened measuring 9.1 mm. No obvious endometrial mass seen, but cannot rule it out.  Right Ovary measures 2.1 x 1.5 x 1.5 cm. It is normal in appearance. Left Ovary measures 1.7 x 1.6 x 1.4 cm. It is normal in appearance. Survey of the adnexa demonstrates no  adnexal masses. There is no free fluid in the cul de sac.  Impression: 1. Heterogeneous, vascular and thickened endometrium for postmenopausal age.  Recommendations: 1.Clinical correlation with the patient's History and Physical Exam.   Darlina GuysAbby M Clarke, RDMS RVT  Assessment/Plan: PMB (postmenopausal bleeding) - Neg exam. Sx resolved. Cx WNL. Check pap and GYN u/s. U/S with EM=9.1 mm. Will RTO for EMB. NSAIDs/cytotec. - Plan: US PELVIS TRANSVANGINAL NON-OB (TV ONLY)  Thickened endometrium - Plan: misoprostol (CYTOTEC) 100 MCG tablet  Cervical cancer screening - Plan: IGP, Aptima HPV  Screening for HPV (human papillomavirus) - Plan: IGP, Aptima HPV  Screening for breast cancer - Pt past due for mammo--order placed to sched. - Plan: MM 3D SCREEN BREAST BILATERAL    Return in about 3 days (around 04/30/2018) for EMB .  Aquilla Shambley B. Zade Falkner, PA-C 04/28/2018 2:39 PM

## 2018-04-27 NOTE — Patient Instructions (Signed)
I value your feedback and entrusting us with your care. If you get a  patient survey, I would appreciate you taking the time to let us know about your experience today. Thank you! 

## 2018-04-28 ENCOUNTER — Encounter: Payer: Self-pay | Admitting: Obstetrics and Gynecology

## 2018-04-28 ENCOUNTER — Telehealth: Payer: Self-pay | Admitting: Obstetrics and Gynecology

## 2018-04-28 DIAGNOSIS — R9389 Abnormal findings on diagnostic imaging of other specified body structures: Secondary | ICD-10-CM | POA: Insufficient documentation

## 2018-04-28 MED ORDER — MISOPROSTOL 100 MCG PO TABS: 100.0000 ug | ORAL_TABLET | Freq: Once | ORAL | 0 refills | Status: DC

## 2018-04-28 NOTE — Telephone Encounter (Signed)
Patient is calling for labs results. Please advise. 

## 2018-05-03 LAB — IGP, APTIMA HPV: HPV Aptima: NEGATIVE

## 2018-06-18 ENCOUNTER — Other Ambulatory Visit: Payer: Self-pay | Admitting: Family Medicine

## 2018-06-18 DIAGNOSIS — F419 Anxiety disorder, unspecified: Secondary | ICD-10-CM

## 2018-07-28 ENCOUNTER — Ambulatory Visit: Payer: Managed Care, Other (non HMO) | Admitting: Family Medicine

## 2018-08-17 ENCOUNTER — Other Ambulatory Visit: Payer: Self-pay | Admitting: Family Medicine

## 2018-08-17 DIAGNOSIS — J302 Other seasonal allergic rhinitis: Secondary | ICD-10-CM

## 2018-09-07 ENCOUNTER — Other Ambulatory Visit: Payer: Self-pay | Admitting: Family Medicine

## 2018-09-07 DIAGNOSIS — J302 Other seasonal allergic rhinitis: Secondary | ICD-10-CM

## 2018-09-14 ENCOUNTER — Other Ambulatory Visit: Payer: Self-pay

## 2018-09-14 DIAGNOSIS — F419 Anxiety disorder, unspecified: Secondary | ICD-10-CM

## 2018-09-14 MED ORDER — BUSPIRONE HCL 10 MG PO TABS: 10.0000 mg | ORAL_TABLET | Freq: Two times a day (BID) | ORAL | 0 refills | Status: DC

## 2018-10-24 ENCOUNTER — Ambulatory Visit: Payer: Managed Care, Other (non HMO) | Admitting: Family Medicine

## 2018-10-27 ENCOUNTER — Other Ambulatory Visit: Payer: Self-pay

## 2018-10-27 ENCOUNTER — Encounter: Payer: Self-pay | Admitting: Family Medicine

## 2018-10-27 ENCOUNTER — Other Ambulatory Visit: Payer: Self-pay | Admitting: Family Medicine

## 2018-10-27 ENCOUNTER — Ambulatory Visit (INDEPENDENT_AMBULATORY_CARE_PROVIDER_SITE_OTHER): Payer: Managed Care, Other (non HMO) | Admitting: Family Medicine

## 2018-10-27 DIAGNOSIS — F3340 Major depressive disorder, recurrent, in remission, unspecified: Secondary | ICD-10-CM

## 2018-10-27 DIAGNOSIS — Z6841 Body Mass Index (BMI) 40.0 and over, adult: Secondary | ICD-10-CM

## 2018-10-27 DIAGNOSIS — R7303 Prediabetes: Secondary | ICD-10-CM

## 2018-10-27 DIAGNOSIS — F419 Anxiety disorder, unspecified: Secondary | ICD-10-CM

## 2018-10-27 DIAGNOSIS — Z0000 Encounter for general adult medical examination without abnormal findings: Secondary | ICD-10-CM

## 2018-10-27 DIAGNOSIS — E7800 Pure hypercholesterolemia, unspecified: Secondary | ICD-10-CM

## 2018-10-27 DIAGNOSIS — F3341 Major depressive disorder, recurrent, in partial remission: Secondary | ICD-10-CM

## 2018-10-27 MED ORDER — BUSPIRONE HCL 10 MG PO TABS: 10.0000 mg | ORAL_TABLET | Freq: Two times a day (BID) | ORAL | 1 refills | Status: DC

## 2018-10-27 NOTE — Assessment & Plan Note (Signed)
Remains significant improvement on Buspar for anxiety control now Some mixed mood disorder GAD/PHQ improved - Failed: Lexapro 10-20mg  (worked but made her feel out of it) - No prior dx / Psych / counseling  Plan: 1. Continue Buspar 10mg  BID re order 2. Future may benefit from counseling/therapist Follow-up in 6 months physical

## 2018-10-27 NOTE — Patient Instructions (Addendum)
Thank you for coming to the office today.  Refilled Buspar 10mg  twice daily.   DUE for FASTING BLOOD WORK (no food or drink after midnight before the lab appointment, only water or coffee without cream/sugar on the morning of)  3 months before  [facility] - CALL IF YOU NEED ORDERS  For Lab Results, once available within 2-3 days of blood draw, you can can log in to MyChart online to view your results and a brief explanation. Also, we can discuss results at next follow-up visit.   Please schedule a Follow-up Appointment to: Return in about 3 months (around 01/27/2019) for Annual Physical.  If you have any other questions or concerns, please feel free to call the office or send a message through [place]. You may also schedule an earlier appointment if necessary.  Additionally, you may be receiving a survey about your experience at our office within a few days to 1 week by e-mail or mail. We value your feedback.  Nobie Putnam, DO [place]

## 2018-10-27 NOTE — Progress Notes (Signed)
Virtual Visit via Telephone The purpose of this virtual visit is to provide medical care while limiting exposure to the novel coronavirus (COVID19) for both patient and office staff.  Consent was obtained for phone visit:  Yes.   Answered questions that patient had about telehealth interaction:  Yes.   I discussed the limitations, risks, security and privacy concerns of performing an evaluation and management service by telephone. I also discussed with the patient that there may be a patient responsible charge related to this service. The patient expressed understanding and agreed to proceed.  Patient Location: Home [facility]  ---------------------------------------------------------------------- Chief Complaint  Patient presents with  . Anxiety    pt need a refill on Buspar    S: Reviewed CMA documentation. I have called patient and gathered additional HPI as follows:  Recurrent Depression, in remission / Generalized Anxiety disorder - Last visit with me 01/2018, for same problem anxiety mood, treated with buspar, see prior notes for background information. - Interval update with increased from 5 to 10 due to worse anxiety with coronavirus pandemic, and now seems improved on 10mg , had episode of heart palpitations back with acute stress now resolved - Today patient reports doing well, needs refill on current dose buspar - Still Seems anxiety is her primary concern. States her mood is mostly in remission, see scores below. She has several life stressors and family stressors affecting her now. Still handling aftermath of her mother's passing.  Denies any high risk travel to areas of current concern for COVID19. Denies any known or suspected exposure to person with or possibly with COVID19.  Denies any fevers, chills, sweats, body ache, cough, shortness of breath, sinus pain or pressure, headache, abdominal pain, diarrhea  Past Medical History:   Diagnosis Date  . Allergic rhinitis   . Anxiety   . Back pain   . Pre-diabetes    Social History   Tobacco Use  . Smoking status: Never Smoker  . Smokeless tobacco: Never Used  Substance Use Topics  . Alcohol use: No    Alcohol/week: 0.0 standard drinks  . Drug use: No    Current Outpatient Medications:  .  busPIRone (BUSPAR) 10 MG tablet, Take 1 tablet (10 mg total) by mouth 2 (two) times daily., Disp: 180 tablet, Rfl: 1 .  fexofenadine (ALLEGRA) 180 MG tablet, Take 1 tablet (180 mg total) by mouth daily., Disp: 90 tablet, Rfl: 3 .  fluticasone (FLONASE) 50 MCG/ACT nasal spray, SHAKE LIQUID AND USE 2 SPRAYS IN EACH NOSTRIL DAILY, Disp: 48 g, Rfl: 3 .  montelukast (SINGULAIR) 10 MG tablet, TAKE 1 TABLET(10 MG) BY MOUTH AT BEDTIME, Disp: 90 tablet, Rfl: 1 .  Multiple Vitamin (MULTIVITAMIN) tablet, Take 1 tablet by mouth daily., Disp: , Rfl:  .  misoprostol (CYTOTEC) 100 MCG tablet, Take 1 tablet (100 mcg total) by mouth once for 1 dose. 1 hour before appt, Disp: 1 tablet, Rfl: 0  [facility] 10/27/2018 01/26/2018 08/17/2017  Decreased Interest 0 1 1  Down, Depressed, Hopeless 0 1 1  PHQ - 2 Score 0 2 2  Altered sleeping 0 0 0  Tired, decreased energy 0 1 2  Change in appetite 0 0 1  Feeling bad or failure about yourself  0 0 1  Trouble concentrating 0 0 0  Moving slowly or fidgety/restless 0 0 0  Suicidal thoughts 0 0 0  PHQ-9 Score 0 3 6  Difficult doing work/chores Not difficult at all  Not difficult at all Not difficult at all    GAD 7 : Generalized Anxiety Score 10/27/2018 01/26/2018 08/17/2017 07/05/2017  Nervous, Anxious, on Edge 3 1 1 1   Control/stop worrying 0 1 1 1   Worry too much - different things 0 1 1 1   Trouble relaxing 3 0 1 1  Restless 0 1 0 1  Easily annoyed or irritable 1 0 0 1  Afraid - awful might happen 0 1 0 0  Total GAD 7 Score 7 5 4 6   Anxiety Difficulty Not difficult at all Not difficult at all Not difficult at all Not difficult at all     -------------------------------------------------------------------------- O: No physical exam performed due to remote telephone encounter.   No results found for this or any previous visit (from the past 2160 hour(s)).  -------------------------------------------------------------------------- A&P:  Problem List Items Addressed This Visit    Anxiety    Remains significant improvement on Buspar for anxiety control now Some mixed mood disorder GAD/PHQ improved - Failed: Lexapro 10-20mg  (worked but made her feel out of it) - No prior dx / Psych / counseling  Plan: 1. Continue Buspar 10mg  BID re order 2. 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 busPIRone (BUSPAR) 10 MG tablet    Sig: Take 1 tablet (10 mg total) by mouth 2 (two) times daily.    Dispense:  180 tablet    Refill:  1    Follow-up: - Return in 3 months for Annual Physical - [facility] for labs, she will do biometric or contact us if need orders.  Patient verbalizes understanding with the above medical recommendations including the limitation of remote medical advice.  Specific follow-up and call-back criteria were given for patient to follow-up or seek medical care more urgently if needed.   - Time spent in direct consultation with patient on phone: 9 minutes  Nobie Putnam, [facility] 10/27/2018, 11:27 AM

## 2018-10-27 NOTE — Assessment & Plan Note (Signed)
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

## 2019-02-23 ENCOUNTER — Telehealth: Payer: Self-pay | Admitting: Family Medicine

## 2019-02-23 DIAGNOSIS — R7303 Prediabetes: Secondary | ICD-10-CM

## 2019-02-23 DIAGNOSIS — F419 Anxiety disorder, unspecified: Secondary | ICD-10-CM

## 2019-02-23 DIAGNOSIS — R002 Palpitations: Secondary | ICD-10-CM

## 2019-02-23 NOTE — Telephone Encounter (Signed)
Pt  Wanted to know f she need labs before appt. Monday. If so she need a lab order for [facility]. Pt  Call back # is [phone]

## 2019-02-23 NOTE — Telephone Encounter (Signed)
Please notify her that I did place some orders now for blood work for [employer]. She may get these anytime before her apt - prefer 1-3 days before.  Nobie Putnam, [facility] 02/23/2019, 2:21 PM

## 2019-02-23 NOTE — Telephone Encounter (Signed)
Left detail message. 

## 2019-02-24 ENCOUNTER — Other Ambulatory Visit: Payer: Self-pay | Admitting: Family Medicine

## 2019-02-25 LAB — BASIC METABOLIC PANEL
BUN/Creatinine Ratio: 18 (ref 9–23)
BUN: 16 mg/dL (ref 6–24)
CO2: 20 mmol/L (ref 20–29)
Calcium: 9.3 mg/dL (ref 8.7–10.2)
Chloride: 104 mmol/L (ref 96–106)
Creatinine, Ser: 0.89 mg/dL (ref 0.57–1.00)
GFR calc Af Amer: 83 mL/min/{1.73_m2} (ref >59)
GFR calc non Af Amer: 72 mL/min/{1.73_m2} (ref >59)
Glucose: 106 mg/dL — ABNORMAL HIGH (ref 65–99)
Potassium: 4.6 mmol/L (ref 3.5–5.2)
Sodium: 140 mmol/L (ref 134–144)

## 2019-02-25 LAB — CBC WITH DIFFERENTIAL/PLATELET
Basophils Absolute: 0 10*3/uL (ref 0.0–0.2)
Basos: 0 %
EOS (ABSOLUTE): 0.1 10*3/uL (ref 0.0–0.4)
Eos: 1 %
Hematocrit: 42 % (ref 34.0–46.6)
Hemoglobin: 14.3 g/dL (ref 11.1–15.9)
Immature Grans (Abs): 0 10*3/uL (ref 0.0–0.1)
Immature Granulocytes: 0 %
Lymphocytes Absolute: 2.7 10*3/uL (ref 0.7–3.1)
Lymphs: 32 %
MCH: 29.6 pg (ref 26.6–33.0)
MCHC: 34 g/dL (ref 31.5–35.7)
MCV: 87 fL (ref 79–97)
Monocytes Absolute: 0.4 10*3/uL (ref 0.1–0.9)
Monocytes: 5 %
Neutrophils Absolute: 5.3 10*3/uL (ref 1.4–7.0)
Neutrophils: 62 %
Platelets: 266 10*3/uL (ref 150–450)
RBC: 4.83 x10E6/uL (ref 3.77–5.28)
RDW: 12.9 % (ref 11.7–15.4)
WBC: 8.4 10*3/uL (ref 3.4–10.8)

## 2019-02-25 LAB — HEMOGLOBIN A1C
Est. average glucose Bld gHb Est-mCnc: 114 mg/dL
Hgb A1c MFr Bld: 5.6 % (ref 4.8–5.6)

## 2019-02-25 LAB — TSH: TSH: 3.16 u[IU]/mL (ref 0.450–4.500)

## 2019-02-25 LAB — T4, FREE: Free T4: 1.21 ng/dL (ref 0.82–1.77)

## 2019-03-06 ENCOUNTER — Ambulatory Visit: Payer: Managed Care, Other (non HMO) | Admitting: Family Medicine

## 2019-03-06 ENCOUNTER — Other Ambulatory Visit: Payer: Self-pay

## 2019-03-06 ENCOUNTER — Encounter: Payer: Self-pay | Admitting: Family Medicine

## 2019-03-06 VITALS — BP 124/64 | HR 82 | Temp 98.7°F | Resp 16 | Ht 61.0 in | Wt 221.4 lb

## 2019-03-06 DIAGNOSIS — F3340 Major depressive disorder, recurrent, in remission, unspecified: Secondary | ICD-10-CM | POA: Diagnosis not present

## 2019-03-06 DIAGNOSIS — F419 Anxiety disorder, unspecified: Secondary | ICD-10-CM

## 2019-03-06 DIAGNOSIS — R002 Palpitations: Secondary | ICD-10-CM

## 2019-03-06 DIAGNOSIS — S46912A Strain of unspecified muscle, fascia and tendon at shoulder and upper arm level, left arm, initial encounter: Secondary | ICD-10-CM

## 2019-03-06 DIAGNOSIS — Z6841 Body Mass Index (BMI) 40.0 and over, adult: Secondary | ICD-10-CM

## 2019-03-06 MED ORDER — CYCLOBENZAPRINE HCL 10 MG PO TABS: 5.0000 mg | ORAL_TABLET | Freq: Every evening | ORAL | 2 refills | Status: DC | PRN

## 2019-03-06 NOTE — Assessment & Plan Note (Signed)
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

## 2019-03-06 NOTE — Assessment & Plan Note (Signed)
Repeat episode, seems provoked, more tachycardia Can be deconditioning anxiety among other factors Not consistent with any exertional symptoms or other concerns. Reassuring - advised to monitor and future can reconsider return to Cardiology if indicated

## 2019-03-06 NOTE — Assessment & Plan Note (Signed)
Abnormal weight gain Previously on diet / exercise regimen, now off limited by COVID restrictions on exercise

## 2019-03-06 NOTE — Assessment & Plan Note (Signed)
Remains significant improvement on Buspar for anxiety control - but had breakthrough episode Some mixed mood disorder GAD/PHQ improved - Failed: Lexapro 10-20mg  (worked but made her feel out of it) - No prior dx / Psych / counseling  Plan 1. Continue Buspar 10mg  BID 2. Future may benefit from counseling/therapist Follow-up in 6 month

## 2019-03-06 NOTE — Patient Instructions (Addendum)
Thank you for coming to the office today.  Start Cyclobenzapine (Flexeril) 10mg  tablets (muscle relaxant) - start with half (cut) to one whole pill at night for muscle relaxant - may make you sedated or sleepy (be careful driving or working on this) if tolerated you can take half to whole tab 2 to 3 times daily or every 8 hours as needed - Prefer to only take at night as needed  Continue Buspar for anxiety.  Most likely your symptoms are related to stress, caffeine and anxiety.  BP is improved  Keep improving exercise. If you are not as active this can make your heart more likely to race.  I do recommend getting a mammogram done, flu shot, and Cologuard colon cancer screening test.   Please schedule a Follow-up Appointment to: Return in about 6 months (around 09/04/2019) for 6 month follow Anxiety/Mood.  If you have any other questions or concerns, please feel free to call the office or send a message through [place]. You may also schedule an earlier appointment if necessary.  Additionally, you may be receiving a survey about your experience at our office within a few days to 1 week by e-mail or mail. We value your feedback.  Nobie Putnam, DO [place]

## 2019-03-06 NOTE — Progress Notes (Signed)
Subjective:    Patient ID: Haley Woods, female    DOB: August 26, 1960, 58 y.o.   MRN: [id number]  Haley Woods is a 58 y.o. female presenting on 03/06/2019 for Palpitations (Happened once at night)   HPI   Generalized Anxiety disorder / Palpitations / PMH Recurrent Depression, in remission Last visit 10/2018 virtual visit by phone. See note. - Reports that recent episode about 1-2 weeks ago, her cats woke her up overnight after she was asleep, she had to go get their food, then she got back to bed and her heart was racing, and she felt "clammy", she thinks that had some caffeine the previous night, and she has now eliminated caffeine and no further episodes. She denied any chest pain or other symptoms, primarily just racing and felt anxious. - Admits work stress. 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 Also death in the family and she is helping care for father in law who is poor in health. States her mood is mostly in remission, see scores below. - Limited exercise or walking in past few months. But she is active at work. - She does feel better since that episode, since stopped caffeine. - Continues on Buspar 10mg  BID with good results. Off SSRI  Left Upper Extremity Strain Additional concern - left neck pain or spasm, and affecting her Left arm with some aching. Had similar before with chiropractor and it has improved. Worse sleeping on it, and lifting up at work with arms, has had similar flare up before. Has taken muscle relaxant before.  Morbid Obesity BMI >44 Admits elevated weight gain +  Health Maintenance: Due for Flu Shot, declines today despite counseling on benefits  Offered Breast Cancer Screening w/ Mammogram and Colon CA Screening Cologuard, she has declined any screen test at this time, she prefers to wait until 2021, was not comfortable with COVID19 pandemic currently did not want to go anywhere.  [facility] 03/06/2019 10/27/2018  01/26/2018  Decreased Interest 0 0 1  Down, Depressed, Hopeless 1 0 1  PHQ - 2 Score 1 0 2  Altered sleeping 1 0 0  Tired, decreased energy 2 0 1  Change in appetite 0 0 0  Feeling bad or failure about yourself  1 0 0  Trouble concentrating 0 0 0  Moving slowly or fidgety/restless 0 0 0  Suicidal thoughts 0 0 0  PHQ-9 Score 5 0 3  Difficult doing work/chores Not difficult at all Not difficult at all Not difficult at all   GAD 7 : Generalized Anxiety Score 03/06/2019 10/27/2018 01/26/2018 08/17/2017  Nervous, Anxious, on Edge 2 3 1 1   Control/stop worrying 1 0 1 1  Worry too much - different things 1 0 1 1  Trouble relaxing 1 3 0 1  Restless 0 0 1 0  Easily annoyed or irritable 0 1 0 0  Afraid - awful might happen 1 0 1 0  Total GAD 7 Score 6 7 5 4   Anxiety Difficulty Not difficult at all Not difficult at all Not difficult at all Not difficult at all     Social History   Tobacco Use  . Smoking status: Never Smoker  . Smokeless tobacco: Never Used  Substance Use Topics  . Alcohol use: No    Alcohol/week: 0.0 standard drinks  . Drug use: No    Review of Systems Per HPI unless specifically indicated above     Objective:  BP 124/64 (BP Location: Left Arm, Cuff Size: Large)   Pulse 82   Temp 98.7 F (37.1 C) (Oral)   Resp 16   Ht 5\' 1"  (1.549 m)   Wt 221 lb 6.4 oz (100.4 kg)   LMP 01/17/2016 (Within Weeks)   BMI 41.83 kg/m   Wt Readings from Last 3 Encounters:  03/06/19 221 lb 6.4 oz (100.4 kg)  04/27/18 213 lb (96.6 kg)  01/26/18 215 lb (97.5 kg)    Physical Exam Vitals signs and nursing note reviewed.  Constitutional:      General: She is not in acute distress.    Appearance: She is well-developed. She is not diaphoretic.     Comments: Well-appearing, comfortable, cooperative, obese  HENT:     Head: Normocephalic and atraumatic.  Eyes:     General:        Right eye: No discharge.        Left eye: No discharge.     Conjunctiva/sclera: Conjunctivae  normal.  Neck:     Musculoskeletal: Normal range of motion and neck supple.     Thyroid: No thyromegaly.  Cardiovascular:     Rate and Rhythm: Normal rate and regular rhythm.     Heart sounds: Normal heart sounds. No murmur.     Comments: No ectopy Pulmonary:     Effort: Pulmonary effort is normal. No respiratory distress.     Breath sounds: Normal breath sounds. No wheezing or rales.  Musculoskeletal: Normal range of motion.  Lymphadenopathy:     Cervical: No cervical adenopathy.  Skin:    General: Skin is warm and dry.     Findings: No erythema or rash.  Neurological:     Mental Status: She is alert and oriented to person, place, and time.  Psychiatric:        Behavior: Behavior normal.     Comments: Well groomed, good eye contact, normal speech and thoughts    Results for orders placed or performed in visit on 02/24/19  CBC with Differential/Platelet  Result Value Ref Range   WBC 8.4 3.4 - 10.8 x10E3/uL   RBC 4.83 3.77 - 5.28 x10E6/uL   Hemoglobin 14.3 11.1 - 15.9 g/dL   Hematocrit 42.0 34.0 - 46.6 %   MCV 87 79 - 97 fL   MCH 29.6 26.6 - 33.0 pg   MCHC 34.0 31.5 - 35.7 g/dL   RDW 12.9 11.7 - 15.4 %   Platelets 266 150 - 450 x10E3/uL   Neutrophils 62 Not Estab. %   Lymphs 32 Not Estab. %   Monocytes 5 Not Estab. %   Eos 1 Not Estab. %   Basos 0 Not Estab. %   Neutrophils Absolute 5.3 1.4 - 7.0 x10E3/uL   Lymphocytes Absolute 2.7 0.7 - 3.1 x10E3/uL   Monocytes Absolute 0.4 0.1 - 0.9 x10E3/uL   EOS (ABSOLUTE) 0.1 0.0 - 0.4 x10E3/uL   Basophils Absolute 0.0 0.0 - 0.2 x10E3/uL   Immature Granulocytes 0 Not Estab. %   Immature Grans (Abs) 0.0 0.0 - 0.1 H84O9/GE  Basic metabolic panel  Result Value Ref Range   Glucose 106 (H) 65 - 99 mg/dL   BUN 16 6 - 24 mg/dL   Creatinine, Ser 0.89 0.57 - 1.00 mg/dL   GFR calc non Af Amer 72 >59 mL/min/1.73   GFR calc Af Amer 83 >59 mL/min/1.73   BUN/Creatinine Ratio 18 9 - 23   Sodium 140 134 - 144 mmol/L   Potassium 4.6 3.5 -  5.2 mmol/L   Chloride 104 96 - 106 mmol/L   CO2 20 20 - 29 mmol/L   Calcium 9.3 8.7 - 10.2 mg/dL  Hemoglobin Y7X  Result Value Ref Range   Hgb A1c MFr Bld 5.6 4.8 - 5.6 %   Est. average glucose Bld gHb Est-mCnc 114 mg/dL  T4, free  Result Value Ref Range   Free T4 1.21 0.82 - 1.77 ng/dL  TSH  Result Value Ref Range   TSH 3.160 0.450 - 4.500 uIU/mL      Assessment & Plan:   Problem List Items Addressed This Visit    Anxiety - Primary    Remains significant improvement on Buspar for anxiety control - but had breakthrough episode Some mixed mood disorder GAD/PHQ improved - Failed: Lexapro 10-20mg  (worked but made her feel out of it) - No prior dx / Psych / counseling  Plan 1. Continue Buspar 10mg  BID 2. Future may benefit from counseling/therapist Follow-up in 6 month      Intermittent palpitations    Repeat episode, seems provoked, more tachycardia Can be deconditioning anxiety among other factors Not consistent with any exertional symptoms or other concerns. 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 cyclobenzaprine (FLEXERIL) 10 MG tablet    Sig: Take 0.5-1 tablets (5-10 mg total) by  mouth at bedtime as needed for muscle spasms.    Dispense:  30 tablet    Refill:  2    Follow up plan: Return in about 6 months (around 09/04/2019) for 6 month follow Anxiety/Mood.   09/06/2019, DO [facility] 03/06/2019, 8:13 AM

## 2019-04-08 ENCOUNTER — Other Ambulatory Visit: Payer: Self-pay | Admitting: Family Medicine

## 2019-04-08 DIAGNOSIS — J302 Other seasonal allergic rhinitis: Secondary | ICD-10-CM

## 2019-05-10 ENCOUNTER — Other Ambulatory Visit: Payer: Self-pay | Admitting: Family Medicine

## 2019-05-10 DIAGNOSIS — F419 Anxiety disorder, unspecified: Secondary | ICD-10-CM

## 2019-05-10 MED ORDER — BUSPIRONE HCL 10 MG PO TABS: 10.0000 mg | ORAL_TABLET | Freq: Two times a day (BID) | ORAL | 0 refills | Status: DC

## 2019-06-12 ENCOUNTER — Other Ambulatory Visit: Payer: Self-pay | Admitting: Family Medicine

## 2019-06-12 DIAGNOSIS — F419 Anxiety disorder, unspecified: Secondary | ICD-10-CM

## 2019-09-08 ENCOUNTER — Other Ambulatory Visit: Payer: Self-pay | Admitting: Family Medicine

## 2019-09-08 DIAGNOSIS — F419 Anxiety disorder, unspecified: Secondary | ICD-10-CM

## 2019-09-17 ENCOUNTER — Other Ambulatory Visit: Payer: Self-pay | Admitting: Family Medicine

## 2019-09-17 DIAGNOSIS — J302 Other seasonal allergic rhinitis: Secondary | ICD-10-CM

## 2019-09-17 NOTE — Telephone Encounter (Signed)
Requested Prescriptions  Pending Prescriptions Disp Refills  . fluticasone (FLONASE) 50 MCG/ACT nasal spray [Pharmacy Med Name: FLUTICASONE NASAL SP (120) RX] 48 g 3    Sig: SHAKE LIQUID AND USE 2 SPRAYS IN EACH NOSTRIL DAILY     Ear, Nose, and Throat: Nasal Preparations - Corticosteroids Passed - 09/17/2019  2:27 PM      Passed - Valid encounter within last 12 months    Recent Outpatient Visits          6 months ago Anxiety   [facility] [place], Netta Neat, DO   10 months ago Anxiety   [facility] [place], Netta Neat, DO   1 year ago Annual physical exam   [facility] Smitty Cords, DO   2 years ago Anxiety   [facility] [place], Netta Neat, DO   2 years ago Anxiety   [facility] [place], Netta Neat, [place]

## 2019-10-19 ENCOUNTER — Other Ambulatory Visit: Payer: Self-pay | Admitting: Family Medicine

## 2019-10-19 DIAGNOSIS — J302 Other seasonal allergic rhinitis: Secondary | ICD-10-CM

## 2019-10-19 NOTE — Telephone Encounter (Signed)
Requested Prescriptions  Pending Prescriptions Disp Refills   montelukast (SINGULAIR) 10 MG tablet [Pharmacy Med Name: MONTELUKAST 10MG  TABLETS] 90 tablet 1    Sig: TAKE 1 TABLET(10 MG) BY MOUTH AT BEDTIME     Pulmonology:  Leukotriene Inhibitors Passed - 10/19/2019  5:07 PM      Passed - Valid encounter within last 12 months    Recent Outpatient Visits          7 months ago Anxiety   [facility] [place] [facility], DO   11 months ago Anxiety   [facility], [place] Charon, DO   1 year ago Annual physical exam   [facility], DO   2 years ago Anxiety   [facility], DO   2 years ago Anxiety   [facility] [place], [place], Netta Neat

## 2019-11-24 ENCOUNTER — Other Ambulatory Visit: Payer: Self-pay | Admitting: Family Medicine

## 2019-11-24 DIAGNOSIS — F419 Anxiety disorder, unspecified: Secondary | ICD-10-CM

## 2019-11-24 NOTE — Telephone Encounter (Signed)
Requested  medications are  due for refill today yes  Requested medications are on the active medication list yes  Last refill 4/23  Last visit 10 months ago  Future visit scheduled no  Notes to clinic failed protocol due to no visit within 6 months

## 2020-02-02 ENCOUNTER — Encounter: Payer: Self-pay | Admitting: Family Medicine

## 2020-02-02 ENCOUNTER — Ambulatory Visit: Payer: Managed Care, Other (non HMO) | Admitting: Family Medicine

## 2020-02-02 ENCOUNTER — Other Ambulatory Visit: Payer: Self-pay

## 2020-02-02 VITALS — BP 119/73 | HR 67 | Temp 97.3°F | Resp 16 | Ht 61.0 in | Wt 209.0 lb

## 2020-02-02 DIAGNOSIS — E7800 Pure hypercholesterolemia, unspecified: Secondary | ICD-10-CM | POA: Diagnosis not present

## 2020-02-02 DIAGNOSIS — Z0000 Encounter for general adult medical examination without abnormal findings: Secondary | ICD-10-CM | POA: Diagnosis not present

## 2020-02-02 DIAGNOSIS — R7303 Prediabetes: Secondary | ICD-10-CM

## 2020-02-02 DIAGNOSIS — F3340 Major depressive disorder, recurrent, in remission, unspecified: Secondary | ICD-10-CM | POA: Diagnosis not present

## 2020-02-02 NOTE — Patient Instructions (Addendum)
Thank you for coming to the office today.  Think about COVID vaccine again.   Colon Cancer Screening: - For all adults age 58+ routine colon cancer screening is highly recommended.     - Recent guidelines from [place] recommend starting age of 8 - Early detection of colon cancer is important, because often there are no warning signs or symptoms, also if found early usually it can be cured. Late stage is hard to treat.  - If you are not interested in Colonoscopy screening (if done and normal you could be cleared for 5 to 10 years until next due), then Cologuard is an excellent alternative for screening test for Colon Cancer. It is highly sensitive for detecting DNA of colon cancer from even the earliest stages. Also, there is NO bowel prep required. - If Cologuard is NEGATIVE, then it is good for 3 years before next due - If Cologuard is POSITIVE, then it is strongly advised to get a Colonoscopy, which allows the GI doctor to locate the source of the cancer or polyp (even very early stage) and treat it by removing it. ------------------------- If you would like to proceed with Cologuard (stool DNA test) - FIRST, call your insurance company and tell them you want to check cost of Cologuard tell them CPT Code [phone] (it may be completely covered and you could get for no cost, OR max cost without any coverage is about $600). Also, keep in mind if you do NOT open the kit, and decide not to do the test, you will NOT be charged, you should contact the company if you decide not to do the test. - If you want to proceed, you can notify us (phone message, [place], or at next visit) and we will order it for you. The test kit will be delivered to you house within about 1 week. Follow instructions to collect sample, you may call the company for any help or questions, 24/7 telephone support at [phone].   Please schedule a Follow-up Appointment to: Return in about 1 year (around  02/01/2021) for 1 year Annual Physical ([facility]).  If you have any other questions or concerns, please feel free to call the office or send a message through [place]. You may also schedule an earlier appointment if necessary.  Additionally, you may be receiving a survey about your experience at our office within a few days to 1 week by e-mail or mail. We value your feedback.  Nobie Putnam, DO [place]

## 2020-02-02 NOTE — Progress Notes (Signed)
Subjective:    Patient ID: Haley Woods, female    DOB: [birth year], 59 y.o.   MRN: [id number]  Haley Woods is a 59 y.o. female presenting on 02/02/2020 for Annual Exam   HPI   Here for Annual Physical and Lab Review.  Generalized Anxiety disorder / Palpitations / PMH Recurrent Depression,inremission She has come off Buspar, 08/2019 ran out and unable to return sooner. Feels overall she is doing well off Buspar. - Limited exercise or walking in past few months. But she is active at work. - She does feel better since prior episode, since stopped caffeine. - Remains off SSRI  Morbid Obesity BMI >39 Hyperlipidemia Improved weight loss, down 12 lbs Prior elevated LDL Due for labs at [facility] Maintenance:  Offered Breast Cancer Screening w/ Mammogram and Colon CA Screening Cologuard, she has declined any screen test at this time, she prefers to wait until 2022, was not comfortable with COVID19 pandemic currently did not want to go anywhere.  Declined COVID19 vaccine at this time.  Due for Flu Shot, declines today despite counseling on benefits   Depression screen [facility] 2/9 02/02/2020 03/06/2019 10/27/2018  Decreased Interest 0 0 0  Down, Depressed, Hopeless 0 1 0  PHQ - 2 Score 0 1 0  Altered sleeping 0 1 0  Tired, decreased energy 1 2 0  Change in appetite 0 0 0  Feeling bad or failure about yourself  0 1 0  Trouble concentrating 0 0 0  Moving slowly or fidgety/restless 0 0 0  Suicidal thoughts 0 0 0  PHQ-9 Score 1 5 0  Difficult doing work/chores Not difficult at all Not difficult at all Not difficult at all   GAD 7 : Generalized Anxiety Score 03/06/2019 10/27/2018 01/26/2018 08/17/2017  Nervous, Anxious, on Edge 2 3 1 1   Control/stop worrying 1 0 1 1  Worry too much - different things 1 0 1 1  Trouble relaxing 1 3 0 1  Restless 0 0 1 0  Easily annoyed or irritable 0 1 0 0  Afraid - awful might happen 1 0 1 0  Total GAD 7 Score 6 7 5 4   Anxiety Difficulty  Not difficult at all Not difficult at all Not difficult at all Not difficult at all      Past Medical History:  Diagnosis Date  . Allergic rhinitis   . Anxiety   . Back pain   . Pre-diabetes    Past Surgical History:  Procedure Laterality Date  . CHOLECYSTECTOMY  1999  . DIAGNOSTIC LAPAROSCOPY  1998   with hydrotubation  . DILATION AND CURETTAGE OF UTERUS     1998/1999   Social History   Socioeconomic History  . Marital status: Married    Spouse name: Dwayne  . Number of children: 1  . Years of education: Not on file  . Highest education level: Not on file  Occupational History    Employer: [employer]  Tobacco Use  . Smoking status: Never Smoker  . Smokeless tobacco: Never Used  Vaping Use  . Vaping Use: Never used  Substance and Sexual Activity  . Alcohol use: No    Alcohol/week: 0.0 standard drinks  . Drug use: No  . Sexual activity: Yes    Birth control/protection: Post-menopausal  Other Topics Concern  . Not on file  Social History Narrative  . Not on file   Social Determinants of Health   Financial Resource Strain:   . Difficulty of  Paying Living Expenses: Not on file  Food Insecurity:   . Worried About Programme researcher, broadcasting/film/video in the Last Year: Not on file  . Ran Out of Food in the Last Year: Not on file  Transportation Needs:   . Lack of Transportation (Medical): Not on file  . Lack of Transportation (Non-Medical): Not on file  Physical Activity:   . Days of Exercise per Week: Not on file  . Minutes of Exercise per Session: Not on file  Stress:   . Feeling of Stress : Not on file  Social Connections:   . Frequency of Communication with Friends and Family: Not on file  . Frequency of Social Gatherings with Friends and Family: Not on file  . Attends Religious Services: Not on file  . Active Member of Clubs or Organizations: Not on file  . Attends Banker Meetings: Not on file  . Marital Status: Not on file  Intimate Partner Violence:   .  Fear of Current or Ex-Partner: Not on file  . Emotionally Abused: Not on file  . Physically Abused: Not on file  . Sexually Abused: Not on file   Family History  Problem Relation Age of Onset  . Heart disease Mother   . Congestive Heart Failure Mother   . Colon cancer Father 23       passed recurrence age 29  . Thyroid disease Father   . Diabetes Brother        type 2  . Heart disease Brother   . Heart disease Maternal Grandmother   . Heart disease Maternal Grandfather   . Diabetes Paternal Grandmother        type 2  . Diabetes Paternal Aunt        type 2  . Breast cancer Neg Hx   . Ovarian cancer Neg Hx    Current Outpatient Medications on File Prior to Visit  Medication Sig  . cyclobenzaprine (FLEXERIL) 10 MG tablet Take 0.5-1 tablets (5-10 mg total) by mouth at bedtime as needed for muscle spasms.  . fexofenadine (ALLEGRA) 180 MG tablet Take 1 tablet (180 mg total) by mouth daily.  . fluticasone (FLONASE) 50 MCG/ACT nasal spray SHAKE LIQUID AND USE 2 SPRAYS IN EACH NOSTRIL DAILY  . montelukast (SINGULAIR) 10 MG tablet TAKE 1 TABLET(10 MG) BY MOUTH AT BEDTIME  . Multiple Vitamin (MULTIVITAMIN) tablet Take 1 tablet by mouth daily.  . misoprostol (CYTOTEC) 100 MCG tablet Take 1 tablet (100 mcg total) by mouth once for 1 dose. 1 hour before appt   No current facility-administered medications on file prior to visit.    Review of Systems  Constitutional: Negative for activity change, appetite change, chills, diaphoresis, fatigue and fever.  HENT: Negative for congestion and hearing loss.   Eyes: Negative for visual disturbance.  Respiratory: Negative for apnea, cough, chest tightness, shortness of breath and wheezing.   Cardiovascular: Negative for chest pain, palpitations and leg swelling.  Gastrointestinal: Negative for abdominal pain, constipation, diarrhea, nausea and vomiting.  Endocrine: Negative for cold intolerance.  Genitourinary: Negative for difficulty urinating,  dysuria, frequency and hematuria.  Musculoskeletal: Negative for arthralgias and neck pain.  Skin: Negative for rash.  Allergic/Immunologic: Negative for environmental allergies.  Neurological: Negative for dizziness, weakness, light-headedness, numbness and headaches.  Hematological: Negative for adenopathy.  Psychiatric/Behavioral: Negative for behavioral problems, dysphoric mood and sleep disturbance.   Per HPI unless specifically indicated above      Objective:    BP 119/73  Pulse 67   Temp (!) 97.3 F (36.3 C) (Temporal)   Resp 16   Ht 5\' 1"  (1.549 m)   Wt 209 lb (94.8 kg)   LMP 01/17/2016 (Within Weeks)   SpO2 97%   BMI 39.49 kg/m   Wt Readings from Last 3 Encounters:  02/02/20 209 lb (94.8 kg)  03/06/19 221 lb 6.4 oz (100.4 kg)  04/27/18 213 lb (96.6 kg)    Physical Exam Vitals and nursing note reviewed.  Constitutional:      General: She is not in acute distress.    Appearance: She is well-developed. She is obese. She is not diaphoretic.     Comments: Well-appearing, comfortable, cooperative  HENT:     Head: Normocephalic and atraumatic.  Eyes:     General:        Right eye: No discharge.        Left eye: No discharge.     Conjunctiva/sclera: Conjunctivae normal.     Pupils: Pupils are equal, round, and reactive to light.  Neck:     Thyroid: No thyromegaly.  Cardiovascular:     Rate and Rhythm: Normal rate and regular rhythm.     Heart sounds: Normal heart sounds. No murmur heard.   Pulmonary:     Effort: Pulmonary effort is normal. No respiratory distress.     Breath sounds: Normal breath sounds. No wheezing or rales.  Abdominal:     General: Bowel sounds are normal. There is no distension.     Palpations: Abdomen is soft. There is no mass.     Tenderness: There is no abdominal tenderness.  Musculoskeletal:        General: No tenderness. Normal range of motion.     Cervical back: Normal range of motion and neck supple.     Right lower leg: No  edema.     Left lower leg: No edema.     Comments: Upper / Lower Extremities: - Normal muscle tone, strength bilateral upper extremities 5/5, lower extremities 5/5  Lymphadenopathy:     Cervical: No cervical adenopathy.  Skin:    General: Skin is warm and dry.     Findings: No erythema or rash.  Neurological:     Mental Status: She is alert and oriented to person, place, and time.     Comments: Distal sensation intact to light touch all extremities  Psychiatric:        Behavior: Behavior normal.     Comments: Well groomed, good eye contact, normal speech and thoughts      Recent Labs    02/24/19 0843  HGBA1C 5.6     Results for orders placed or performed in visit on 02/24/19  CBC with Differential/Platelet  Result Value Ref Range   WBC 8.4 3.4 - 10.8 x10E3/uL   RBC 4.83 3.77 - 5.28 x10E6/uL   Hemoglobin 14.3 11.1 - 15.9 g/dL   Hematocrit 16.142.0 09.634.0 - 46.6 %   MCV 87 79 - 97 fL   MCH 29.6 26.6 - 33.0 pg   MCHC 34.0 31 - 35 g/dL   RDW 04.512.9 40.911.7 - 81.115.4 %   Platelets 266 150 - 450 x10E3/uL   Neutrophils 62 Not Estab. %   Lymphs 32 Not Estab. %   Monocytes 5 Not Estab. %   Eos 1 Not Estab. %   Basos 0 Not Estab. %   Neutrophils Absolute 5.3 1 - 7 x10E3/uL   Lymphocytes Absolute 2.7 0 - 3 x10E3/uL   Monocytes Absolute  0.4 0 - 0 x10E3/uL   EOS (ABSOLUTE) 0.1 0.0 - 0.4 x10E3/uL   Basophils Absolute 0.0 0 - 0 x10E3/uL   Immature Granulocytes 0 Not Estab. %   Immature Grans (Abs) 0.0 0.0 - 0.1 x10E3/uL  Basic metabolic panel  Result Value Ref Range   Glucose 106 (H) 65 - 99 mg/dL   BUN 16 6 - 24 mg/dL   Creatinine, Ser 1.06 0.57 - 1.00 mg/dL   GFR calc non Af Amer 72 >59 mL/min/1.73   GFR calc Af Amer 83 >59 mL/min/1.73   BUN/Creatinine Ratio 18 9 - 23   Sodium 140 134 - 144 mmol/L   Potassium 4.6 3.5 - 5.2 mmol/L   Chloride 104 96 - 106 mmol/L   CO2 20 20 - 29 mmol/L   Calcium 9.3 8.7 - 10.2 mg/dL  Hemoglobin Y6R  Result Value Ref Range   Hgb A1c MFr Bld 5.6 4.8  - 5.6 %   Est. 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 Not on any therapy currently  2. Encourage improved lifestyle - low carb, low sugar diet, reduce portion size, continue improving regular exercise       Morbid obesity (HCC)    Abnormal weight gain, BMI >39 Hyperlipidemia, Depression, Prediabetes      Hyperlipidemia    Uncontrolled cholesterol elevated LDL, otherwise appropriate HDL Last lipid panel 02/2019 The 10-year ASCVD risk score Denman George DC Jr., et al., 2013) is: 2.6%* (Cholesterol units were assumed)  Plan:  Not indicated for statin at this time  Improve regular exercise cardio Yearly lipids biometric       Other Visit Diagnoses    Annual physical exam    -  Primary      Updated Health Maintenance information Declined screening tests, see above HPI Encouraged improvement to lifestyle with diet and exercise - Goal of weight loss   No orders of the defined types were placed in this encounter.     Follow up plan: Return in about 1 year (around 02/01/2021) for Annual Physical.   Saralyn Pilar, DO [facility] 02/02/2020, 9:24 AM

## 2020-02-03 NOTE — Assessment & Plan Note (Signed)
Uncontrolled cholesterol elevated LDL, otherwise appropriate HDL Last lipid panel 02/2019 The 10-year ASCVD risk score Denman George DC Jr., et al., 2013) is: 2.6%* (Cholesterol units were assumed)  Plan:  Not indicated for statin at this time  Improve regular exercise cardio Yearly lipids biometric

## 2020-02-03 NOTE — Assessment & Plan Note (Signed)
Abnormal weight gain, BMI >39 Hyperlipidemia, Depression, Prediabetes

## 2020-02-03 NOTE — Assessment & Plan Note (Signed)
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

## 2020-02-03 NOTE — Assessment & Plan Note (Signed)
Previous controlled A1c Still concern with morbid obesity, possibly elevated BP and HLD  Plan:  1. Not on any therapy currently  2. Encourage improved lifestyle - low carb, low sugar diet, reduce portion size, continue improving regular exercise

## 2020-03-19 ENCOUNTER — Telehealth (INDEPENDENT_AMBULATORY_CARE_PROVIDER_SITE_OTHER): Payer: Managed Care, Other (non HMO) | Admitting: Family Medicine

## 2020-03-19 ENCOUNTER — Other Ambulatory Visit: Payer: Self-pay

## 2020-03-19 ENCOUNTER — Encounter: Payer: Self-pay | Admitting: Family Medicine

## 2020-03-19 DIAGNOSIS — M5410 Radiculopathy, site unspecified: Secondary | ICD-10-CM | POA: Insufficient documentation

## 2020-03-19 MED ORDER — PREDNISONE 50 MG PO TABS: ORAL_TABLET | ORAL | 0 refills | Status: DC

## 2020-03-19 NOTE — Progress Notes (Signed)
Virtual Visit via Telephone  The purpose of this virtual visit is to provide medical care while limiting exposure to the novel coronavirus (COVID19) for both patient and office staff.  Consent was obtained for phone visit:  Yes.   Answered questions that patient had about telehealth interaction:  Yes.   I discussed the limitations, risks, security and privacy concerns of performing an evaluation and management service by telephone. I also discussed with the patient that there may be a patient responsible charge related to this service. The patient expressed understanding and agreed to proceed.  Patient is at home and is accessed via telephone Services are provided by Charlaine Dalton, FNP-C from [facility])  ---------------------------------------------------------------------- Chief Complaint  Patient presents with  . Hip Injury    lower back pain that radiates down the buttucks and the back of the leg x 1.5 weeks The pt twisted her leg getting out of the truck.    S: Reviewed CMA documentation. I have called patient and gathered additional HPI as follows:  Ms. Haley Woods presents for virtual telemedicine visit via phone with concerns for right lower back pain with right sided radiculopathy.  Reports she was getting out of her truck approx 1.5 weeks ago, twisted her leg, and has been having discomfort down the right buttock into the right leg since.  Has been using heat/ice on her lower back and doing low back stretches/exercises with some improvement in her low back pain but has not lessened her radicular symptoms.  Has been taking ibuprofen with minimal relief.  Pain is exacerbated by walking.  Denies saddle anesthesia or change in bowel/bladder function.  Patient is currently home Denies any high risk travel to areas of current concern for COVID19. Denies any known or suspected exposure to person with or possibly with COVID19.  Past Medical History:  Diagnosis  Date  . Allergic rhinitis   . Anxiety   . Back pain   . Pre-diabetes    Social History   Tobacco Use  . Smoking status: Never Smoker  . Smokeless tobacco: Never Used  Vaping Use  . Vaping Use: Never used  Substance Use Topics  . Alcohol use: No    Alcohol/week: 0.0 standard drinks  . Drug use: No    Current Outpatient Medications:  .  cyclobenzaprine (FLEXERIL) 10 MG tablet, Take 0.5-1 tablets (5-10 mg total) by mouth at bedtime as needed for muscle spasms., Disp: 30 tablet, Rfl: 2 .  fexofenadine (ALLEGRA) 180 MG tablet, Take 1 tablet (180 mg total) by mouth daily., Disp: 90 tablet, Rfl: 3 .  fluticasone (FLONASE) 50 MCG/ACT nasal spray, SHAKE LIQUID AND USE 2 SPRAYS IN EACH NOSTRIL DAILY, Disp: 48 g, Rfl: 3 .  montelukast (SINGULAIR) 10 MG tablet, TAKE 1 TABLET(10 MG) BY MOUTH AT BEDTIME, Disp: 90 tablet, Rfl: 1 .  Multiple Vitamin (MULTIVITAMIN) tablet, Take 1 tablet by mouth daily., Disp: , Rfl:  .  predniSONE (DELTASONE) 50 MG tablet, Take 1 tablet daily x 5 days, Disp: 5 tablet, Rfl: 0  Depression screen [facility] 2/9 02/02/2020 03/06/2019 10/27/2018  Decreased Interest 0 0 0  Down, Depressed, Hopeless 0 1 0  PHQ - 2 Score 0 1 0  Altered sleeping 0 1 0  Tired, decreased energy 1 2 0  Change in appetite 0 0 0  Feeling bad or failure about yourself  0 1 0  Trouble concentrating 0 0 0  Moving slowly or fidgety/restless 0 0 0  Suicidal thoughts 0  0 0  PHQ-9 Score 1 5 0  Difficult doing work/chores Not difficult at all Not difficult at all Not difficult at all    GAD 7 : Generalized Anxiety Score 03/06/2019 10/27/2018 01/26/2018 08/17/2017  Nervous, Anxious, on Edge 2 3 1 1   Control/stop worrying 1 0 1 1  Worry too much - different things 1 0 1 1  Trouble relaxing 1 3 0 1  Restless 0 0 1 0  Easily annoyed or irritable 0 1 0 0  Afraid - awful might happen 1 0 1 0  Total GAD 7 Score 6 7 5 4   Anxiety Difficulty Not difficult at all Not difficult at all Not difficult at all Not  difficult at all    -------------------------------------------------------------------------- O: No physical exam performed due to remote telephone encounter.  Physical Exam: Patient remotely monitored without video.  Verbal communication appropriate.  Cognition normal.  No results found for this or any previous visit (from the past 2160 hour(s)).  -------------------------------------------------------------------------- A&P:  Problem List Items Addressed This Visit      Nervous and Auditory   Back pain with right-sided radiculopathy - Primary    Right sided radicular symptoms x 1.5 weeks, has been taking cyclobenzaprine to help alleviate muscle spasms at night to help with sleep, using heat/ice without much relief.  Discussed starting prednisone 50mg  daily x 5 days, that there could be a nerve impingement and if symptoms return after completing course of prednisone, may need referral to physical therapy and/or orthopedics.  Advised to avoid all NSAIDs while on prednisone to avoid GI upset.  Patient verbalized understanding.  Plan: 1. Begin predisone 50mg  daily x 5 days 2. Continue heat/ice and stretches for lower back and right hip 3. Avoid all NSAIDs 4. RTC PRN      Relevant Medications   predniSONE (DELTASONE) 50 MG tablet      Meds ordered this encounter  Medications  . predniSONE (DELTASONE) 50 MG tablet    Sig: Take 1 tablet daily x 5 days    Dispense:  5 tablet    Refill:  0    Follow-up: - Return if symptoms worsen or fail to improve  Patient verbalizes understanding with the above medical recommendations including the limitation of remote medical advice.  Specific follow-up and call-back criteria were given for patient to follow-up or seek medical care more urgently if needed.  - Time spent in direct consultation with patient on phone: 6 minutes  , FNP-C [facility] 03/19/2020, 3:23 PM

## 2020-03-19 NOTE — Assessment & Plan Note (Signed)
Right sided radicular symptoms x 1.5 weeks, has been taking cyclobenzaprine to help alleviate muscle spasms at night to help with sleep, using heat/ice without much relief.  Discussed starting prednisone 50mg  daily x 5 days, that there could be a nerve impingement and if symptoms return after completing course of prednisone, may need referral to physical therapy and/or orthopedics.  Advised to avoid all NSAIDs while on prednisone to avoid GI upset.  Patient verbalized understanding.  Plan: 1. Begin predisone 50mg  daily x 5 days 2. Continue heat/ice and stretches for lower back and right hip 3. Avoid all NSAIDs 4. RTC PRN

## 2020-03-19 NOTE — Patient Instructions (Signed)
I have sent in a prescription for prednisone 50mg , to take 1 tablet daily for the next 5 days.  As we discussed, avoid all NSAIDs while using this medication, as it can cause stomach upset.  Continue alternating heat/ice and working on lower back stretches.    If symptoms persist after completing prednisone, we may need to look into physical therapy and/or a referral to Orthopedics for additional evaluation and possible imaging, including an MRI  We will plan to see you back if your symptoms worsen or fail to improve  You will receive a survey after today's visit either digitally by e-mail or paper by . Your experiences and feedback matter to [employer].  Please respond so we know how we are doing as we provide care for you.  Call us with any questions/concerns/needs.  It is my goal to be available to you for your health concerns.  Thanks for choosing me to be a partner in your healthcare needs!  [place], FNP-C Family Nurse Practitioner [facility] Phone: [phone]

## 2020-03-26 ENCOUNTER — Encounter: Payer: Self-pay | Admitting: Family Medicine

## 2020-04-16 ENCOUNTER — Other Ambulatory Visit: Payer: Self-pay | Admitting: Family Medicine

## 2020-04-16 DIAGNOSIS — J302 Other seasonal allergic rhinitis: Secondary | ICD-10-CM

## 2020-07-06 DIAGNOSIS — M5126 Other intervertebral disc displacement, lumbar region: Secondary | ICD-10-CM

## 2020-07-06 DIAGNOSIS — M5136 Other intervertebral disc degeneration, lumbar region: Secondary | ICD-10-CM

## 2020-07-06 DIAGNOSIS — M5410 Radiculopathy, site unspecified: Secondary | ICD-10-CM

## 2020-07-08 NOTE — Addendum Note (Signed)
Addended by: Smitty Cords on: 07/08/2020 12:07 PM   Modules accepted: Orders

## 2020-10-23 ENCOUNTER — Other Ambulatory Visit: Payer: Self-pay | Admitting: Family Medicine

## 2020-10-23 DIAGNOSIS — J302 Other seasonal allergic rhinitis: Secondary | ICD-10-CM

## 2020-12-20 ENCOUNTER — Other Ambulatory Visit: Payer: Self-pay | Admitting: Neurosurgery

## 2020-12-30 ENCOUNTER — Inpatient Hospital Stay: Admission: RE | Admit: 2020-12-30 | Payer: 59 | Source: Ambulatory Visit

## 2020-12-31 ENCOUNTER — Encounter
Admission: RE | Admit: 2020-12-31 | Discharge: 2020-12-31 | Disposition: A | Discharge status: Home / Self Care | Payer: Managed Care, Other (non HMO) | Source: Ambulatory Visit | Attending: Neurosurgery | Admitting: Neurosurgery

## 2020-12-31 ENCOUNTER — Other Ambulatory Visit: Payer: Self-pay

## 2020-12-31 DIAGNOSIS — Z01812 Encounter for preprocedural laboratory examination: Secondary | ICD-10-CM | POA: Diagnosis present

## 2020-12-31 LAB — BASIC METABOLIC PANEL
Anion gap: 8 (ref 5–15)
BUN: 15 mg/dL (ref 6–20)
CO2: 28 mmol/L (ref 22–32)
Calcium: 9.2 mg/dL (ref 8.9–10.3)
Chloride: 104 mmol/L (ref 98–111)
Creatinine, Ser: 0.74 mg/dL (ref 0.44–1.00)
GFR, Estimated: >60 mL/min (ref >60)
Glucose, Bld: 99 mg/dL (ref 70–99)
Potassium: 3.8 mmol/L (ref 3.5–5.1)
Sodium: 140 mmol/L (ref 135–145)

## 2020-12-31 LAB — CBC
HCT: 40.6 % (ref 36.0–46.0)
Hemoglobin: 13.6 g/dL (ref 12.0–15.0)
MCH: 29.4 pg (ref 26.0–34.0)
MCHC: 33.5 g/dL (ref 30.0–36.0)
MCV: 87.9 fL (ref 80.0–100.0)
Platelets: 251 10*3/uL (ref 150–400)
RBC: 4.62 MIL/uL (ref 3.87–5.11)
RDW: 13.2 % (ref 11.5–15.5)
WBC: 8.8 10*3/uL (ref 4.0–10.5)
nRBC: 0 % (ref 0.0–0.2)

## 2020-12-31 LAB — TYPE AND SCREEN
ABO/RH(D): A POS
Antibody Screen: NEGATIVE

## 2020-12-31 LAB — PROTIME-INR
INR: 0.9 (ref 0.8–1.2)
Prothrombin Time: 12.6 seconds (ref 11.4–15.2)

## 2020-12-31 LAB — SURGICAL PCR SCREEN
MRSA, PCR: NEGATIVE
Staphylococcus aureus: NEGATIVE

## 2020-12-31 LAB — APTT: aPTT: 30 seconds (ref 24–36)

## 2020-12-31 NOTE — Patient Instructions (Addendum)
Your procedure is scheduled on:01/08/2021  Report to the Registration Desk on the 1st floor of the [facility]. To find out your arrival time, please call [phone] between 1PM - 3PM on: 01/07/2021  REMEMBER: Instructions that are not followed completely may result in serious medical risk, up to and including death; or upon the discretion of your surgeon and anesthesiologist your surgery may need to be rescheduled.  Do not eat food after midnight the night before surgery.  No gum chewing, lozengers or hard candies.  You may however, drink CLEAR liquids up to 2 hours before you are scheduled to arrive for your surgery. Do not drink anything within 2 hours of your scheduled arrival time.  Clear liquids include: - water  - apple juice without pulp - gatorade (not RED, PURPLE, OR BLUE) - black coffee or tea (Do NOT add milk or creamers to the coffee or tea) Do NOT drink anything that is not on this list.    TAKE THESE MEDICATIONS THE MORNING OF SURGERY WITH A SIP OF WATER:  Allegra     Follow recommendations from or PCP or surgeon  regarding stopping Meloxicam. Stop one week prior to surgery per surgeon's note. Last dose will be on 12/31/2020   One week prior to surgery: Stop Anti-inflammatories (NSAIDS) such as Advil, Aleve, Ibuprofen, Motrin, Naproxen, Naprosyn and Aspirin based products such as Excedrin, Goodys Powder, BC Powder. Stop ANY OVER THE COUNTER supplements until after surgery like zinc,  cranberry, Vit D 3, and multivitamin You may however, continue to take Tylenol if needed for pain up until the day of surgery.  No Alcohol for 24 hours before or after surgery.  No Smoking including e-cigarettes for 24 hours prior to surgery.  No chewable tobacco products for at least 6 hours prior to surgery.  No nicotine patches on the day of surgery.  Do not use any "recreational" drugs for at least a week prior to your surgery.  Please be advised that the combination of  cocaine and anesthesia may have negative outcomes, up to and including death. If you test positive for cocaine, your surgery will be cancelled.  On the morning of surgery brush your teeth with toothpaste and water, you may rinse your mouth with mouthwash if you wish. Do not swallow any toothpaste or mouthwash.  Do not wear jewelry, make-up, hairpins, clips or nail polish.  Do not wear lotions, powders, or perfumes.   Do not shave body from the neck down 48 hours prior to surgery just in case you cut yourself which could leave a site for infection.  Also, freshly shaved skin may become irritated if using the CHG soap.  Contact lenses, hearing aids and dentures may not be worn into surgery.  Do not bring valuables to the hospital. [facility] is not responsible for any missing/lost belongings or valuables.   Use CHG Soap or wipes as directed on instruction sheet.     Notify your doctor if there is any change in your medical condition (cold, fever, infection).  Wear comfortable clothing (specific to your surgery type) to the hospital.  After surgery, you can help prevent lung complications by doing breathing exercises.  Take deep breaths and cough every 1-2 hours. Your doctor may order a device called an Incentive Spirometer to help you take deep breaths.  If you are being admitted to the hospital overnight, leave your suitcase in the car. After surgery it may be brought to your room.  If you are  being discharged the day of surgery, you will not be allowed to drive home. You will need a responsible adult (18 years or older) to drive you home and stay with you that night.   If you are taking public transportation, you will need to have a responsible adult (18 years or older) with you. Please confirm with your physician that it is acceptable to use public transportation.   Please call the Pre-admissions Testing Dept. at [phone] if you have any questions about these  instructions.  Surgery Visitation Policy:  Patients undergoing a surgery or procedure may have one family member or support person with them as long as that person is not COVID-19 positive or experiencing its symptoms.  That person may remain in the waiting area during the procedure.  Inpatient Visitation:    Visiting hours are 7 a.m. to 8 p.m. Inpatients will be allowed two visitors daily. The visitors may change each day during the patient's stay. No visitors under the age of 33. Any visitor under the age of 35 must be accompanied by an adult. The visitor must pass COVID-19 screenings, use hand sanitizer when entering and exiting the patient's room and wear a mask at all times, including in the patient's room. Patients must also wear a mask when staff or their visitor are in the room. Masking is required regardless of vaccination status.

## 2021-01-08 ENCOUNTER — Ambulatory Visit: Payer: Managed Care, Other (non HMO) | Admitting: Anesthesiology

## 2021-01-08 ENCOUNTER — Ambulatory Visit: Payer: Managed Care, Other (non HMO)

## 2021-01-08 ENCOUNTER — Ambulatory Visit
Admission: RE | Admit: 2021-01-08 | Discharge: 2021-01-08 | Disposition: A | Discharge status: Home / Self Care | Payer: Managed Care, Other (non HMO) | Attending: Neurosurgery | Admitting: Neurosurgery

## 2021-01-08 ENCOUNTER — Encounter
Admission: RE | Disposition: A | Discharge status: Home / Self Care | Payer: Self-pay | Source: Home / Self Care | Attending: Neurosurgery

## 2021-01-08 DIAGNOSIS — M5117 Intervertebral disc disorders with radiculopathy, lumbosacral region: Secondary | ICD-10-CM | POA: Insufficient documentation

## 2021-01-08 DIAGNOSIS — Z880 Allergy status to penicillin: Secondary | ICD-10-CM | POA: Insufficient documentation

## 2021-01-08 DIAGNOSIS — Z791 Long term (current) use of non-steroidal anti-inflammatories (NSAID): Secondary | ICD-10-CM | POA: Insufficient documentation

## 2021-01-08 DIAGNOSIS — Z79899 Other long term (current) drug therapy: Secondary | ICD-10-CM | POA: Diagnosis not present

## 2021-01-08 DIAGNOSIS — M5416 Radiculopathy, lumbar region: Secondary | ICD-10-CM | POA: Diagnosis present

## 2021-01-08 DIAGNOSIS — Z419 Encounter for procedure for purposes other than remedying health state, unspecified: Secondary | ICD-10-CM

## 2021-01-08 LAB — ABO/RH: ABO/RH(D): A POS

## 2021-01-08 SURGERY — FAR LATERAL DECOMPRESSION 1 LEVEL
Anesthesia: General | Laterality: Right

## 2021-01-08 MED ORDER — BUPIVACAINE HCL 0.5 % IJ SOLN
INTRAMUSCULAR | Status: DC | PRN
  Administered 2021-01-08: 10 mL

## 2021-01-08 MED ORDER — CHLORHEXIDINE GLUCONATE 0.12 % MT SOLN: 15.0000 mL | Freq: Once | OROMUCOSAL | Status: AC

## 2021-01-08 MED ORDER — KETAMINE HCL 50 MG/5ML IJ SOSY
PREFILLED_SYRINGE | INTRAMUSCULAR | Status: AC
  Filled 2021-01-08: qty 5

## 2021-01-08 MED ORDER — THROMBIN 5000 UNITS EX SOLR
CUTANEOUS | Status: DC | PRN
  Administered 2021-01-08: 5000 [IU] via TOPICAL

## 2021-01-08 MED ORDER — FENTANYL CITRATE (PF) 100 MCG/2ML IJ SOLN
INTRAMUSCULAR | Status: DC | PRN
  Administered 2021-01-08 (×2): 50 ug via INTRAVENOUS

## 2021-01-08 MED ORDER — GLYCOPYRROLATE 0.2 MG/ML IJ SOLN
INTRAMUSCULAR | Status: AC
  Filled 2021-01-08: qty 1

## 2021-01-08 MED ORDER — LACTATED RINGERS IV SOLN: INTRAVENOUS | Status: DC

## 2021-01-08 MED ORDER — PROPOFOL 10 MG/ML IV BOLUS
INTRAVENOUS | Status: DC | PRN
  Administered 2021-01-08: 200 mg via INTRAVENOUS

## 2021-01-08 MED ORDER — DEXAMETHASONE SODIUM PHOSPHATE 10 MG/ML IJ SOLN
INTRAMUSCULAR | Status: DC | PRN
  Administered 2021-01-08: 10 mg via INTRAVENOUS

## 2021-01-08 MED ORDER — 0.9 % SODIUM CHLORIDE (POUR BTL) OPTIME
TOPICAL | Status: DC | PRN
  Administered 2021-01-08: 1000 mL

## 2021-01-08 MED ORDER — FENTANYL CITRATE (PF) 100 MCG/2ML IJ SOLN
INTRAMUSCULAR | Status: AC
  Filled 2021-01-08: qty 2

## 2021-01-08 MED ORDER — METHOCARBAMOL 500 MG PO TABS: 500.0000 mg | ORAL_TABLET | Freq: Four times a day (QID) | ORAL | 0 refills | Status: DC

## 2021-01-08 MED ORDER — LIDOCAINE HCL 4 % MT SOLN
OROMUCOSAL | Status: DC | PRN
  Administered 2021-01-08: 4 mL via TOPICAL

## 2021-01-08 MED ORDER — HYDROCODONE-ACETAMINOPHEN 5-325 MG PO TABS: 1.0000 | ORAL_TABLET | Freq: Four times a day (QID) | ORAL | 0 refills | Status: AC | PRN

## 2021-01-08 MED ORDER — ONDANSETRON HCL 4 MG/2ML IJ SOLN
INTRAMUSCULAR | Status: AC
  Filled 2021-01-08: qty 2

## 2021-01-08 MED ORDER — DEXMEDETOMIDINE (PRECEDEX) IN NS 20 MCG/5ML (4 MCG/ML) IV SYRINGE
PREFILLED_SYRINGE | INTRAVENOUS | Status: AC
  Filled 2021-01-08: qty 5

## 2021-01-08 MED ORDER — LIDOCAINE HCL (CARDIAC) PF 100 MG/5ML IV SOSY
PREFILLED_SYRINGE | INTRAVENOUS | Status: DC | PRN
  Administered 2021-01-08: 50 mg via INTRAVENOUS

## 2021-01-08 MED ORDER — SODIUM CHLORIDE 0.9 % IV SOLN
INTRAVENOUS | Status: DC | PRN
  Administered 2021-01-08: 20 ug/min via INTRAVENOUS

## 2021-01-08 MED ORDER — KETAMINE HCL 50 MG/ML IJ SOLN
INTRAMUSCULAR | Status: DC | PRN
  Administered 2021-01-08: 20 mg via INTRAVENOUS

## 2021-01-08 MED ORDER — DEXAMETHASONE SODIUM PHOSPHATE 10 MG/ML IJ SOLN
INTRAMUSCULAR | Status: AC
  Filled 2021-01-08: qty 1

## 2021-01-08 MED ORDER — ACETAMINOPHEN 10 MG/ML IV SOLN
INTRAVENOUS | Status: DC | PRN
  Administered 2021-01-08: 1000 mg via INTRAVENOUS

## 2021-01-08 MED ORDER — LIDOCAINE HCL (PF) 2 % IJ SOLN
INTRAMUSCULAR | Status: AC
  Filled 2021-01-08: qty 5

## 2021-01-08 MED ORDER — CEFAZOLIN SODIUM-DEXTROSE 2-4 GM/100ML-% IV SOLN
2.0000 g | INTRAVENOUS | Status: AC
  Administered 2021-01-08: 2 g via INTRAVENOUS

## 2021-01-08 MED ORDER — GLYCOPYRROLATE 0.2 MG/ML IJ SOLN
INTRAMUSCULAR | Status: DC | PRN
  Administered 2021-01-08: .2 mg via INTRAVENOUS

## 2021-01-08 MED ORDER — SENNA 8.6 MG PO TABS: 1.0000 | ORAL_TABLET | Freq: Every day | ORAL | 0 refills | Status: AC | PRN

## 2021-01-08 MED ORDER — PHENYLEPHRINE HCL (PRESSORS) 10 MG/ML IV SOLN
INTRAVENOUS | Status: DC | PRN
  Administered 2021-01-08 (×2): 100 ug via INTRAVENOUS

## 2021-01-08 MED ORDER — ORAL CARE MOUTH RINSE: 15.0000 mL | Freq: Once | OROMUCOSAL | Status: AC

## 2021-01-08 MED ORDER — METHYLPREDNISOLONE ACETATE 40 MG/ML IJ SUSP
INTRAMUSCULAR | Status: DC | PRN
  Administered 2021-01-08: 40 mg

## 2021-01-08 MED ORDER — MIDAZOLAM HCL 2 MG/2ML IJ SOLN
INTRAMUSCULAR | Status: DC | PRN
  Administered 2021-01-08: 2 mg via INTRAVENOUS

## 2021-01-08 MED ORDER — SODIUM CHLORIDE 0.9 % IV SOLN
INTRAVENOUS | Status: DC | PRN
  Administered 2021-01-08: 30 mL

## 2021-01-08 MED ORDER — MIDAZOLAM HCL 2 MG/2ML IJ SOLN
INTRAMUSCULAR | Status: AC
  Filled 2021-01-08: qty 2

## 2021-01-08 MED ORDER — FENTANYL CITRATE (PF) 100 MCG/2ML IJ SOLN: 25.0000 ug | INTRAMUSCULAR | Status: DC | PRN

## 2021-01-08 MED ORDER — ONDANSETRON HCL 4 MG/2ML IJ SOLN
INTRAMUSCULAR | Status: DC | PRN
  Administered 2021-01-08: 4 mg via INTRAVENOUS

## 2021-01-08 MED ORDER — BUPIVACAINE-EPINEPHRINE (PF) 0.5% -1:200000 IJ SOLN
INTRAMUSCULAR | Status: DC | PRN
  Administered 2021-01-08: 4 mL

## 2021-01-08 MED ORDER — CEFAZOLIN SODIUM-DEXTROSE 2-4 GM/100ML-% IV SOLN
INTRAVENOUS | Status: AC
  Filled 2021-01-08: qty 100

## 2021-01-08 MED ORDER — PROMETHAZINE HCL 25 MG/ML IJ SOLN: 6.2500 mg | INTRAMUSCULAR | Status: DC | PRN

## 2021-01-08 MED ORDER — SUCCINYLCHOLINE CHLORIDE 200 MG/10ML IV SOSY
PREFILLED_SYRINGE | INTRAVENOUS | Status: AC
  Filled 2021-01-08: qty 10

## 2021-01-08 MED ORDER — FAMOTIDINE 20 MG PO TABS: 20.0000 mg | ORAL_TABLET | Freq: Once | ORAL | Status: AC

## 2021-01-08 MED ORDER — CHLORHEXIDINE GLUCONATE 0.12 % MT SOLN
OROMUCOSAL | Status: AC
  Administered 2021-01-08: 15 mL via OROMUCOSAL
  Filled 2021-01-08: qty 15

## 2021-01-08 MED ORDER — SUCCINYLCHOLINE CHLORIDE 200 MG/10ML IV SOSY
PREFILLED_SYRINGE | INTRAVENOUS | Status: DC | PRN
  Administered 2021-01-08: 100 mg via INTRAVENOUS

## 2021-01-08 MED ORDER — ACETAMINOPHEN 10 MG/ML IV SOLN
INTRAVENOUS | Status: AC
  Filled 2021-01-08: qty 100

## 2021-01-08 MED ORDER — FAMOTIDINE 20 MG PO TABS
ORAL_TABLET | ORAL | Status: AC
  Administered 2021-01-08: 20 mg via ORAL
  Filled 2021-01-08: qty 1

## 2021-01-08 MED ORDER — HEMOSTATIC AGENTS (NO CHARGE) OPTIME
TOPICAL | Status: DC | PRN
  Administered 2021-01-08: 1 via TOPICAL

## 2021-01-08 MED ORDER — DEXMEDETOMIDINE (PRECEDEX) IN NS 20 MCG/5ML (4 MCG/ML) IV SYRINGE
PREFILLED_SYRINGE | INTRAVENOUS | Status: DC | PRN
  Administered 2021-01-08: 8 ug via INTRAVENOUS
  Administered 2021-01-08: 12 ug via INTRAVENOUS

## 2021-01-08 SURGICAL SUPPLY — 63 items
"PENCIL ELECTRO HAND CTR " (MISCELLANEOUS) IMPLANT
ADH SKN CLS APL DERMABOND .7 (GAUZE/BANDAGES/DRESSINGS) ×1
AGENT HMST MTR 8 SURGIFLO (HEMOSTASIS) ×1
APL PRP STRL LF DISP 70% ISPRP (MISCELLANEOUS) ×2
BUR NEURO DRILL SOFT 3.0X3.8M (BURR) ×3 IMPLANT
CHLORAPREP W/TINT 26 (MISCELLANEOUS) ×6 IMPLANT
CNTNR SPEC 2.5X3XGRAD LEK (MISCELLANEOUS) ×1
CONT SPEC 4OZ STER OR WHT (MISCELLANEOUS) ×2
CONT SPEC 4OZ STRL OR WHT (MISCELLANEOUS) ×1
CONTAINER SPEC 2.5X3XGRAD LEK (MISCELLANEOUS) ×1 IMPLANT
COUNTER NEEDLE 20/40 LG (NEEDLE) ×3 IMPLANT
CUP MEDICINE 2OZ PLAST GRAD ST (MISCELLANEOUS) ×6 IMPLANT
DERMABOND ADVANCED (GAUZE/BANDAGES/DRESSINGS) ×2
DERMABOND ADVANCED .7 DNX12 (GAUZE/BANDAGES/DRESSINGS) ×1 IMPLANT
DRAPE C ARM PK CFD 31 SPINE (DRAPES) ×3 IMPLANT
DRAPE INCISE IOBAN 66X45 STRL (DRAPES) ×2 IMPLANT
DRAPE LAPAROTOMY 100X77 ABD (DRAPES) ×3 IMPLANT
DRAPE MICROSCOPE SPINE 48X150 (DRAPES) ×3 IMPLANT
DRAPE SURG 17X11 SM STRL (DRAPES) ×12 IMPLANT
DRSG OPSITE POSTOP 3X4 (GAUZE/BANDAGES/DRESSINGS) ×2 IMPLANT
ELECT CAUTERY BLADE TIP 2.5 (TIP) ×3
ELECT EZSTD 165MM 6.5IN (MISCELLANEOUS) ×3
ELECT REM PT RETURN 9FT ADLT (ELECTROSURGICAL) ×3
ELECTRODE CAUTERY BLDE TIP 2.5 (TIP) ×1 IMPLANT
ELECTRODE EZSTD 165MM 6.5IN (MISCELLANEOUS) IMPLANT
ELECTRODE REM PT RTRN 9FT ADLT (ELECTROSURGICAL) ×1 IMPLANT
GAUZE 4X4 16PLY ~~LOC~~+RFID DBL (SPONGE) ×3 IMPLANT
GLOVE SURG SYN 6.5 ES PF (GLOVE) ×6 IMPLANT
GLOVE SURG SYN 6.5 PF PI (GLOVE) ×2 IMPLANT
GLOVE SURG SYN 8.5  E (GLOVE) ×6
GLOVE SURG SYN 8.5 E (GLOVE) ×3 IMPLANT
GLOVE SURG SYN 8.5 PF PI (GLOVE) ×3 IMPLANT
GLOVE SURG UNDER POLY LF SZ6.5 (GLOVE) ×3 IMPLANT
GOWN SRG LRG LVL 4 IMPRV REINF (GOWNS) ×1 IMPLANT
GOWN SRG XL LVL 3 NONREINFORCE (GOWNS) ×1 IMPLANT
GOWN STRL NON-REIN TWL XL LVL3 (GOWNS) ×3
GOWN STRL REIN LRG LVL4 (GOWNS) ×3
GRADUATE 1200CC STRL 31836 (MISCELLANEOUS) ×3 IMPLANT
KIT SPINAL PRONEVIEW (KITS) ×3 IMPLANT
MANIFOLD NEPTUNE II (INSTRUMENTS) ×3 IMPLANT
MARKER SKIN DUAL TIP RULER LAB (MISCELLANEOUS) ×6 IMPLANT
NDL SAFETY ECLIPSE 18X1.5 (NEEDLE) ×1 IMPLANT
NEEDLE HYPO 18GX1.5 SHARP (NEEDLE) ×3
NEEDLE HYPO 22GX1.5 SAFETY (NEEDLE) ×3 IMPLANT
NS IRRIG 1000ML POUR BTL (IV SOLUTION) ×3 IMPLANT
PACK LAMINECTOMY NEURO (CUSTOM PROCEDURE TRAY) ×3 IMPLANT
PAD ARMBOARD 7.5X6 YLW CONV (MISCELLANEOUS) ×3 IMPLANT
PENCIL ELECTRO HAND CTR (MISCELLANEOUS) ×3 IMPLANT
SPOGE SURGIFLO 8M (HEMOSTASIS) ×2
SPONGE SURGIFLO 8M (HEMOSTASIS) ×1 IMPLANT
SPONGE T-LAP 4X18 ~~LOC~~+RFID (SPONGE) ×2 IMPLANT
SUT DVC VLOC 3-0 CL 6 P-12 (SUTURE) ×3 IMPLANT
SUT VIC AB 0 CT1 27 (SUTURE) ×3
SUT VIC AB 0 CT1 27XCR 8 STRN (SUTURE) ×1 IMPLANT
SUT VIC AB 2-0 CT1 18 (SUTURE) ×3 IMPLANT
SYR 10ML LL (SYRINGE) ×3 IMPLANT
SYR 20ML LL LF (SYRINGE) ×3 IMPLANT
SYR 30ML LL (SYRINGE) ×6 IMPLANT
SYR 3ML LL SCALE MARK (SYRINGE) ×3 IMPLANT
TOWEL OR 17X26 4PK STRL BLUE (TOWEL DISPOSABLE) ×9 IMPLANT
TUBING CONNECTING 10 (TUBING) ×2 IMPLANT
TUBING CONNECTING 10' (TUBING) ×1
WATER STERILE IRR 500ML POUR (IV SOLUTION) ×3 IMPLANT

## 2021-01-08 NOTE — Anesthesia Preprocedure Evaluation (Signed)
Anesthesia Evaluation  Patient identified by MRN, date of birth, ID band Patient awake    Reviewed: Allergy & Precautions, H&P , NPO status , Patient's Chart, lab work & pertinent test results, reviewed documented beta blocker date and time   History of Anesthesia Complications Negative for: history of anesthetic complications  Airway Mallampati: II  TM Distance: >3 FB Neck ROM: full    Dental  (+) Dental Advidsory Given, Caps, Teeth Intact, Chipped   Pulmonary neg pulmonary ROS,    Pulmonary exam normal breath sounds clear to auscultation       Cardiovascular Exercise Tolerance: Good negative cardio ROS Normal cardiovascular exam Rhythm:regular Rate:Normal     Neuro/Psych neg Seizures PSYCHIATRIC DISORDERS Anxiety Depression  Neuromuscular disease    GI/Hepatic negative GI ROS, Neg liver ROS,   Endo/Other  neg diabetesMorbid obesity  Renal/GU negative Renal ROS  negative genitourinary   Musculoskeletal   Abdominal   Peds  Hematology negative hematology ROS (+)   Anesthesia Other Findings Past Medical History: No date: Allergic rhinitis No date: Anxiety No date: Back pain No date: Pre-diabetes   Reproductive/Obstetrics negative OB ROS                             Anesthesia Physical Anesthesia Plan  ASA: 3  Anesthesia Plan: General   Post-op Pain Management:    Induction: Intravenous  PONV Risk Score and Plan: 3 and Ondansetron, Dexamethasone, Midazolam, Promethazine and Treatment may vary due to age or medical condition  Airway Management Planned: Oral ETT  Additional Equipment:   Intra-op Plan:   Post-operative Plan: Extubation in OR  Informed Consent: I have reviewed the patients History and Physical, chart, labs and discussed the procedure including the risks, benefits and alternatives for the proposed anesthesia with the patient or authorized representative who has  indicated his/her understanding and acceptance.     Dental Advisory Given  Plan Discussed with: Anesthesiologist, CRNA and Surgeon  Anesthesia Plan Comments:         Anesthesia Quick Evaluation

## 2021-01-08 NOTE — Progress Notes (Signed)
Up ambulating to the bathroom and voiding without difficulty. Does report some numbness in the r leg. No c/o pain. Voices readiness to come home.

## 2021-01-08 NOTE — Op Note (Signed)
Indications: The patient is a 60 yo female who presented with lumbar radiculopathy who failed conservative management.  Findings: far lateral disc herniation at L5/S1.  Preoperative Diagnosis: Lumbar radiculopathy Postoperative Diagnosis: same   EBL: 10 ml IVF: 400 ml Drains: none Disposition: Extubated and Stable to PACU Complications: none  No foley catheter was placed.   Preoperative Note:   Risks of surgery discussed include: infection, bleeding, stroke, coma, death, paralysis, CSF leak, nerve/spinal cord injury, numbness, tingling, weakness, complex regional pain syndrome, recurrent stenosis and/or disc herniation, vascular injury, development of instability, neck/back pain, need for further surgery, persistent symptoms, development of deformity, and the risks of anesthesia. The patient understood these risks and agreed to proceed.  Operative Note:    The patient was then brought from the preoperative center with intravenous access established.  The patient underwent general anesthesia and endotracheal tube intubation, and was then rotated on the Webster rail top where all pressure points were appropriately padded.  The skin was then thoroughly cleansed.  Perioperative antibiotic prophylaxis was administered.  Sterile prep and drapes were then applied and a timeout was then observed.  C-arm was brought into the field under sterile conditions, and the L5/S1 disc space identified and marked with an incision on the right 3.5cm lateral to midline.   Once this was complete a 2 cm incision was opened with the use of a #10 blade knife.  The Metrx tubes were sequentially advanced under lateral fluoroscopy until a 18 x 60 mm Metrx tube was placed over the right L5 pedicle and secured to the bed.    The microscope was then sterilely brought into the field and muscle creep was hemostased with a bipolar and resected with a pituitary rongeur.  A Bovie extender was then used to expose the S1  sacral ala and lateral L5-S1 facet.  Careful attention was placed to not violate the facet capsule. A 3 mm matchstick drill bit was then used to drill off the top of the L5 transverse process, lateral L5/S1 facet, and lateral pars.  The intertransverse membrane was identified, and carefully dissected free from the caudal transverse process.    Using careful dissected, the superior aspect of the R S1 pedicle was identified and exposed down to its junction with the vertebral body.  The disc was identified. The disc was entered using a small Penfield 4. The L5 nerve root was identified and freed using a Penfield 4 and balltip probe.  The disc herniation was identified and dissected free using a balltip probe. The pituitary rongeur was used to remove the extruded disc fragments. Once the nerve root were noted to be relaxed and under less tension the ball-tipped feeler was passed along the foramen proximally to to ensure no residual compression was noted.    A Depo-Medrol soaked Gelfoam pledget was placed along the nerve root for 2 minutes and removed.  The area was irrigated. The tube system was then removed under microscopic visualization and hemostasis was obtained with a bipolar.    The fascial layer was reapproximated with the use of a 0- Vicryl suture.  Subcutaneous tissue layer was reapproximated using 2-0 Vicryl suture.  3-0 monocryl was used on the skin. The skin was then cleansed and Dermabond was used to close the skin opening.  Patient was then rotated back to the preoperative bed awakened from anesthesia and taken to recovery all counts are correct in this case.   I performed the entire procedure with the assistance of Manning Charity  PA Ernestina Penna PA as an Designer, television/film set.  Venetia Night MD

## 2021-01-08 NOTE — Discharge Instructions (Addendum)
Your surgeon has performed an operation on your lumbar spine (low back) to relieve pressure on one or more nerves. Many times, patients feel better immediately after surgery and can "overdo it." Even if you feel well, it is important that you follow these activity guidelines. If you do not let your back heal properly from the surgery, you can increase the chance of a disc herniation and/or return of your symptoms. The following are instructions to help in your recovery once you have been discharged from the hospital.  * Do not take anti-inflammatory medications for 3 days after surgery (naproxen [Aleve], ibuprofen [Advil, Motrin], celecoxib [Celebrex], etc.)  Activity    No bending, lifting, or twisting ("BLT"). Avoid lifting objects heavier than 10 pounds (gallon milk jug).  Where possible, avoid household activities that involve lifting, bending, pushing, or pulling such as laundry, vacuuming, grocery shopping, and childcare. Try to arrange for help from friends and family for these activities while your back heals.  Increase physical activity slowly as tolerated.  Taking short walks is encouraged, but avoid strenuous exercise. Do not jog, run, bicycle, lift weights, or participate in any other exercises unless specifically allowed by your doctor. Avoid prolonged sitting, including car rides.  Talk to your doctor before resuming sexual activity.  You should not drive until cleared by your doctor.  Until released by your doctor, you should not return to work or school.  You should rest at home and let your body heal.   You may shower two days after your surgery.  After showering, lightly dab your incision dry. Do not take a tub bath or go swimming for 3 weeks, or until approved by your doctor at your follow-up appointment.  If you smoke, we strongly recommend that you quit.  Smoking has been proven to interfere with normal healing in your back and will dramatically reduce the success rate of  your surgery. Please contact [facility] (800-QUIT-NOW) and use the resources at www.[facility].com for assistance in stopping smoking.  Surgical Incision   If you have a dressing on your incision, you may remove it three days after your surgery. Keep your incision area clean and dry.  If you have staples or stitches on your incision, you should have a follow up scheduled for removal. If you do not have staples or stitches, you will have steri-strips (small pieces of surgical tape) or Dermabond glue. The steri-strips/glue should begin to peel away within about a week (it is fine if the steri-strips fall off before then). If the strips are still in place one week after your surgery, you may gently remove them.  Diet            You may return to your usual diet. Be sure to stay hydrated.  When to Contact Us  Although your surgery and recovery will likely be uneventful, you may have some residual numbness, aches, and pains in your back and/or legs. This is normal and should improve in the next few weeks.  However, should you experience any of the following, contact us immediately: New numbness or weakness Pain that is progressively getting worse, and is not relieved by your pain medications or rest Bleeding, redness, swelling, pain, or drainage from surgical incision Chills or flu-like symptoms Fever greater than 101.0 F (38.3 C) Problems with bowel or bladder functions Difficulty breathing or shortness of breath Warmth, tenderness, or swelling in your calf  Contact Information During office hours (Monday-Friday 9 am to 5 pm), please call your physician   at [phone] After hours and weekends, please call [phone] and speak with the answering service, who will contact the doctor on call.  If that fails, call the [facility] Operator at [phone] and ask for the Neurosurgery Resident On Call  For a life-threatening emergency, call 911  AMBULATORY SURGERY  DISCHARGE INSTRUCTIONS   The  drugs that you were given will stay in your system until tomorrow so for the next 24 hours you should not:  Drive an automobile Make any legal decisions Drink any alcoholic beverage   You may resume regular meals tomorrow.  Today it is better to start with liquids and gradually work up to solid foods.  You may eat anything you prefer, but it is better to start with liquids, then soup and crackers, and gradually work up to solid foods.   Please notify your doctor immediately if you have any unusual bleeding, trouble breathing, redness and pain at the surgery site, drainage, fever, or pain not relieved by medication.    Additional Instructions:        Please contact your physician with any problems or Same Day Surgery at [phone], Monday through Friday 6 am to 4 pm, or [place] [facility] [place] [facility] number at [phone].  

## 2021-01-08 NOTE — Transfer of Care (Signed)
Immediate Anesthesia Transfer of Care Note  Patient: ROSABEL SERMENO  Procedure(s) Performed: RIGHT L5-S1 FAR LATERAL DISCECTOMY (Right)  Patient Location: PACU  Anesthesia Type:General  Level of Consciousness: sedated  Airway & Oxygen Therapy: Patient Spontanous Breathing and Patient connected to face mask oxygen  Post-op Assessment: Report given to RN and Post -op Vital signs reviewed and stable  Post vital signs: Reviewed  Last Vitals:  Vitals Value Taken Time  BP 111/72 01/08/21 1432  Temp    Pulse 57 01/08/21 1434  Resp 9 01/08/21 1434  SpO2 100 % 01/08/21 1434  Vitals shown include unvalidated device data.  Last Pain:  Vitals:   01/08/21 1049  TempSrc: Temporal  PainSc: 2          Complications: No notable events documented.

## 2021-01-08 NOTE — H&P (Signed)
I have reviewed and confirmed my history and physical from 12/19/2020 with no additions or changes. Plan for R L5-S1 far lateral discectomy.  Risks and benefits reviewed.  Heart sounds normal no MRG. Chest Clear to Auscultation Bilaterally.

## 2021-01-08 NOTE — Anesthesia Procedure Notes (Signed)
Procedure Name: Intubation Date/Time: 01/08/2021 1:14 PM Performed by: Rolla Plate, CRNA Pre-anesthesia Checklist: Patient identified, Patient being monitored, Timeout performed, Emergency Drugs available and Suction available Patient Re-evaluated:Patient Re-evaluated prior to induction Oxygen Delivery Method: Circle system utilized Preoxygenation: Pre-oxygenation with 100% oxygen Induction Type: IV induction Ventilation: Mask ventilation without difficulty Laryngoscope Size: Mac and 3 Grade View: Grade I Tube type: Oral Tube size: 7.0 mm Number of attempts: 1 Airway Equipment and Method: Stylet, Video-laryngoscopy and LTA kit utilized Placement Confirmation: ETT inserted through vocal cords under direct vision, positive ETCO2 and breath sounds checked- equal and bilateral Secured at: 21 cm Tube secured with: Tape Dental Injury: Teeth and Oropharynx as per pre-operative assessment

## 2021-01-08 NOTE — Discharge Summary (Signed)
Physician Discharge Summary  Patient ID: Haley Woods MRN: [id number] DOB/AGE: Oct 02, 1960 60 y.o.  Admit date: 01/08/2021 Discharge date: 01/08/2021  Admission Diagnoses: Lumbar radiculopathy   Discharge Diagnoses:  Active Problems:   * No active hospital problems. *   Discharged Condition: good  Hospital Course: Haley Woods is a 60 y.o female presenting with right sided L5-S1 far lateral disc herniation resulting in lumbar radiculopathy. Her interoperative and post-operative course were uncomplicated and she was discharged home on POD#0 with Norco, Robaxin, and Senna.  Consults: None  Significant Diagnostic Studies: none  Treatments: surgery: Right L5-S1 far lateral discectomy   Discharge Exam: Blood pressure 134/78, pulse 83, temperature 98.2 F (36.8 C), temperature source Temporal, resp. rate 14, last menstrual period 01/17/2016, SpO2 98 %.  5/5 throughout BLE  Disposition: Discharge disposition: 01-Home or Self Care        Allergies as of 01/08/2021       Reactions   Penicillins Hives   Amoxicillin Rash   Oral pill        Medication List     TAKE these medications    Cranberry 500 MG Caps Take 500 mg by mouth daily.   Dialyvite Vitamin D 5000 125 MCG (5000 UT) capsule Generic drug: Cholecalciferol Take 10,000 Units by mouth daily.   fexofenadine 180 MG tablet Commonly known as: ALLEGRA Take 1 tablet (180 mg total) by mouth daily.   fluticasone 50 MCG/ACT nasal spray Commonly known as: FLONASE SHAKE LIQUID AND USE 2 SPRAYS IN EACH NOSTRIL DAILY   gabapentin 300 MG capsule Commonly known as: NEURONTIN Take 600 mg by mouth at bedtime.   HYDROcodone-acetaminophen 5-325 MG tablet Commonly known as: Norco Take 1 tablet by mouth every 6 (six) hours as needed for up to 5 days for moderate pain or severe pain.   methocarbamol 500 MG tablet Commonly known as: Robaxin Take 1 tablet (500 mg total) by mouth 4 (four) times daily.   montelukast 10 MG  tablet Commonly known as: SINGULAIR TAKE 1 TABLET(10 MG) BY MOUTH AT BEDTIME   multivitamin tablet Take 1 tablet by mouth daily.   senna 8.6 MG Tabs tablet Commonly known as: SENOKOT Take 1 tablet (8.6 mg total) by mouth daily as needed for mild constipation.   VISINE OP Place 1 drop into both eyes daily as needed (dry eyes).   zinc gluconate 50 MG tablet Take 50 mg by mouth daily.        Follow-up Information     Susanne Borders, PA Follow up in 2 week(s).   Why: for incision check Contact information: [address] [place] [address] [phone]                 Signed: Susanne Borders 01/08/2021, 2:30 PM

## 2021-01-09 NOTE — Anesthesia Postprocedure Evaluation (Signed)
Anesthesia Post Note  Patient: Haley Woods  Procedure(s) Performed: RIGHT L5-S1 FAR LATERAL DISCECTOMY (Right)  Patient location during evaluation: PACU Anesthesia Type: General Level of consciousness: awake and alert Pain management: pain level controlled Vital Signs Assessment: post-procedure vital signs reviewed and stable Respiratory status: spontaneous breathing, nonlabored ventilation, respiratory function stable and patient connected to nasal cannula oxygen Cardiovascular status: blood pressure returned to baseline and stable Postop Assessment: no apparent nausea or vomiting Anesthetic complications: no   No notable events documented.   Last Vitals:  Vitals:   01/08/21 1538 01/08/21 1640  BP: (!) 152/79 (!) 142/77  Pulse: 81 65  Resp: 16 15  Temp: (!) 36.1 C   SpO2: 93% 97%    Last Pain:  Vitals:   01/08/21 1640  TempSrc:   PainSc: 0-No pain                 Lenard Simmer

## 2021-04-26 ENCOUNTER — Other Ambulatory Visit: Payer: Self-pay | Admitting: Family Medicine

## 2021-04-26 DIAGNOSIS — J302 Other seasonal allergic rhinitis: Secondary | ICD-10-CM

## 2021-04-27 NOTE — Telephone Encounter (Signed)
Requested medication (s) are due for refill today: yes  Requested medication (s) are on the active medication list: yes  Last refill:  10/23/20 for both  Future visit scheduled: no  Notes to clinic:  last OV addressing prescriptions 08/17/2017- called pt and LM on VM to call back and make appt for refills   Requested Prescriptions  Pending Prescriptions Disp Refills   fluticasone (FLONASE) 50 MCG/ACT nasal spray [Pharmacy Med Name: FLUTICASONE NASAL SP (120) RX] 48 g 1    Sig: SHAKE LIQUID AND USE 2 SPRAYS IN EACH NOSTRIL DAILY     Ear, Nose, and Throat: Nasal Preparations - Corticosteroids Failed - 04/26/2021  8:44 PM      Failed - Valid encounter within last 12 months    Recent Outpatient Visits           1 year ago Back pain with right-sided radiculopathy   [facility], Jodelle Gross, FNP   1 year ago Annual physical exam   [facility] Smitty Cords, DO   2 years ago Anxiety   [facility] [place], Netta Neat, DO   2 years ago Anxiety   [facility] [place], Netta Neat, DO   3 years ago Annual physical exam   [facility], Netta Neat, DO               montelukast (SINGULAIR) 10 MG tablet [Pharmacy Med Name: MONTELUKAST 10MG  TABLETS] 90 tablet 1    Sig: TAKE 1 TABLET(10 MG) BY MOUTH AT BEDTIME     Pulmonology:  Leukotriene Inhibitors Failed - 04/26/2021  8:44 PM      Failed - Valid encounter within last 12 months    Recent Outpatient Visits           1 year ago Back pain with right-sided radiculopathy   [facility], FNP   1 year ago Annual physical exam   [facility], DO   2 years ago Anxiety   [facility] [place], [place], DO   2 years ago Anxiety   [facility], [place], DO   3 years ago Annual physical exam   [facility] [place], [place], DO

## 2021-05-06 ENCOUNTER — Encounter: Payer: Self-pay | Admitting: Family Medicine

## 2021-05-06 ENCOUNTER — Ambulatory Visit (INDEPENDENT_AMBULATORY_CARE_PROVIDER_SITE_OTHER): Payer: Managed Care, Other (non HMO) | Admitting: Family Medicine

## 2021-05-06 ENCOUNTER — Other Ambulatory Visit: Payer: Self-pay

## 2021-05-06 VITALS — BP 137/66 | HR 72 | Ht 61.0 in | Wt 200.4 lb

## 2021-05-06 DIAGNOSIS — J302 Other seasonal allergic rhinitis: Secondary | ICD-10-CM

## 2021-05-06 DIAGNOSIS — M5410 Radiculopathy, site unspecified: Secondary | ICD-10-CM | POA: Diagnosis not present

## 2021-05-06 DIAGNOSIS — F3340 Major depressive disorder, recurrent, in remission, unspecified: Secondary | ICD-10-CM | POA: Diagnosis not present

## 2021-05-06 MED ORDER — MONTELUKAST SODIUM 10 MG PO TABS
10.0000 mg | ORAL_TABLET | Freq: Every day | ORAL | 3 refills | Status: DC
Start: 2021-05-06 — End: 2022-04-28

## 2021-05-06 MED ORDER — FLUTICASONE PROPIONATE 50 MCG/ACT NA SUSP: 2.0000 | Freq: Every day | NASAL | 3 refills | Status: DC

## 2021-05-06 NOTE — Patient Instructions (Addendum)
Thank you for coming to the office today.  Refilled Montelukast and Flonase.  You can discontinue Gabapentin 300mg  - OR IF NEED can contact and I change it to a 100mg .  Please schedule a Follow-up Appointment to: Return in about 9 months (around 02/04/2022) for 9 month Annual Physical (she will bring [facility] results).  If you have any other questions or concerns, please feel free to call the office or send a message through MyChart. You may also schedule an earlier appointment if necessary.  Additionally, you may be receiving a survey about your experience at our office within a few days to 1 week by e-mail or mail. We value your feedback.  , DO [facility], Saralyn Pilar

## 2021-05-06 NOTE — Progress Notes (Signed)
Subjective:    Patient ID: Haley Woods, female    DOB: 1960/09/16, 60 y.o.   MRN: [id number]  Haley Woods is a 60 y.o. female presenting on 05/06/2021 for Allergies   HPI  Allergies Environmental / Seasonal Chronic problem improved on Montelukast 10mg  nightly and Flonase regularly.  Chronic Back Pain Followed by Neurosurgery initiated therapy, she has reduced dose down to 300mg  once nightly. She has goal to wean it down to come off if able. Pain has improved overall. And ready to wean off. Also come off Methocarbamol  Morbid Obesity BMI >37 Hyperlipidemia Prior elevated LDL she gets labs [facility] does not have results today.  Generalized Anxiety disorder / Palpitations / PMH Recurrent Depression, in remission Doing well remains off medication previously off Buspar and SNRI  Health Maintenance: Declines testing.  Depression screen [facility] 2/9 05/06/2021 02/02/2020 03/06/2019  Decreased Interest 0 0 0  Down, Depressed, Hopeless 1 0 1  PHQ - 2 Score 1 0 1  Altered sleeping 0 0 1  Tired, decreased energy 1 1 2   Change in appetite 0 0 0  Feeling bad or failure about yourself  0 0 1  Trouble concentrating 0 0 0  Moving slowly or fidgety/restless 0 0 0  Suicidal thoughts 0 0 0  PHQ-9 Score 2 1 5   Difficult doing work/chores Not difficult at all Not difficult at all Not difficult at all   GAD 7 : Generalized Anxiety Score 05/06/2021 03/06/2019 10/27/2018 01/26/2018  Nervous, Anxious, on Edge 1 2 3 1   Control/stop worrying 1 1 0 1  Worry too much - different things 1 1 0 1  Trouble relaxing 1 1 3  0  Restless 0 0 0 1  Easily annoyed or irritable 0 0 1 0  Afraid - awful might happen 1 1 0 1  Total GAD 7 Score 5 6 7 5   Anxiety Difficulty Not difficult at all Not difficult at all Not difficult at all Not difficult at all      Past Medical History:  Diagnosis Date   Allergic rhinitis    Anxiety    Back pain    Pre-diabetes    Past Surgical History:   Procedure Laterality Date   CHOLECYSTECTOMY  1999   DIAGNOSTIC LAPAROSCOPY  1998   with hydrotubation   DILATION AND CURETTAGE OF UTERUS     1998/1999   Social History   Socioeconomic History   Marital status: Married    Spouse name: Dwayne   Number of children: 1   Years of education: Not on file   Highest education level: Not on file  Occupational History    Employer: [employer]  Tobacco Use   Smoking status: Never   Smokeless tobacco: Never  Vaping Use   Vaping Use: Never used  Substance and Sexual Activity   Alcohol use: No    Alcohol/week: 0.0 standard drinks   Drug use: No   Sexual activity: Yes    Birth control/protection: Post-menopausal  Other Topics Concern   Not on file  Social History Narrative   Lives with husband [phone]) and daughter    Social Determinants of Strain: Not on file  Food Insecurity: Not on file  Transportation Needs: Not on file  Physical Activity: Not on file  Stress: Not on file  Social Connections: Not on file  Intimate Partner Violence: Not on file   Family History  Problem Relation Age of  Onset   Heart disease Mother    Congestive Heart Failure Mother    Colon cancer Father 21       passed recurrence age 61   Thyroid disease Father    Diabetes Brother        type 2   Heart disease Brother    Heart disease Maternal Grandmother    Heart disease Maternal Grandfather    Diabetes Paternal Grandmother        type 2   Diabetes Paternal Aunt        type 2   Breast cancer Neg Hx    Ovarian cancer Neg Hx    Current Outpatient Medications on File Prior to Visit  Medication Sig   Cholecalciferol (DIALYVITE VITAMIN D 5000) 125 MCG (5000 UT) capsule Take 10,000 Units by mouth daily.   Cranberry 500 MG CAPS Take 500 mg by mouth daily.   fexofenadine (ALLEGRA) 180 MG tablet Take 1 tablet (180 mg total) by mouth daily.   gabapentin (NEURONTIN) 300 MG capsule Take 300 mg by mouth at bedtime.    Multiple Vitamin (MULTIVITAMIN) tablet Take 1 tablet by mouth daily.   senna (SENOKOT) 8.6 MG TABS tablet Take 1 tablet (8.6 mg total) by mouth daily as needed for mild constipation.   Tetrahydrozoline HCl (VISINE OP) Place 1 drop into both eyes daily as needed (dry eyes).   zinc gluconate 50 MG tablet Take 50 mg by mouth daily.   No current facility-administered medications on file prior to visit.    Review of Systems Per HPI unless specifically indicated above     Objective:    BP 137/66    Pulse 72    Ht 5\' 1"  (1.549 m)    Wt 200 lb 6.4 oz (90.9 kg)    LMP 01/17/2016 (Within Weeks)    SpO2 100%    BMI 37.87 kg/m   Wt Readings from Last 3 Encounters:  05/06/21 200 lb 6.4 oz (90.9 kg)  12/31/20 205 lb (93 kg)  02/02/20 209 lb (94.8 kg)    Physical Exam Vitals and nursing note reviewed.  Constitutional:      General: She is not in acute distress.    Appearance: Normal appearance. She is well-developed. She is obese. She is not diaphoretic.     Comments: Well-appearing, comfortable, cooperative  HENT:     Head: Normocephalic and atraumatic.  Eyes:     General:        Right eye: No discharge.        Left eye: No discharge.     Conjunctiva/sclera: Conjunctivae normal.  Cardiovascular:     Rate and Rhythm: Normal rate.  Pulmonary:     Effort: Pulmonary effort is normal.  Skin:    General: Skin is warm and dry.     Findings: No erythema or rash.  Neurological:     Mental Status: She is alert and oriented to person, place, and time.  Psychiatric:        Mood and Affect: Mood normal.        Behavior: Behavior normal.        Thought Content: Thought content normal.     Comments: Well groomed, good eye contact, normal speech and thoughts   Results for orders placed or performed during the hospital encounter of 01/08/21  ABO/Rh  Result Value Ref Range   ABO/RH(D)      A POS Performed at [facility], [address], [place], [place]  Assessment  & Plan:   Problem List Items Addressed This Visit     Recurrent major depression in remission (HCC)   Morbid obesity (HCC)   Back pain with right-sided radiculopathy   Allergic rhinitis, seasonal - Primary   Relevant Medications   montelukast (SINGULAIR) 10 MG tablet   fluticasone (FLONASE) 50 MCG/ACT nasal spray    Recurrent depression in remission Anxiety Controlled currently, remain off medication  Chronic Back pain Followed by Neurosurgery Can wean down Gabapentin if/when ready and stop 300mg  daily or we can go to 100mg  titration down.  Allergies Refill Singulair and Flonase.  Meds ordered this encounter  Medications   montelukast (SINGULAIR) 10 MG tablet    Sig: Take 1 tablet (10 mg total) by mouth at bedtime.    Dispense:  90 tablet    Refill:  3   fluticasone (FLONASE) 50 MCG/ACT nasal spray    Sig: Place 2 sprays into both nostrils daily.    Dispense:  48 g    Refill:  3      Follow up plan: Return in about 9 months (around 02/04/2022) for 9 month Annual Physical (she will bring [facility] results).  , DO [facility] 05/06/2021, 3:23 PM

## 2022-04-27 ENCOUNTER — Other Ambulatory Visit: Payer: Self-pay | Admitting: Family Medicine

## 2022-04-27 DIAGNOSIS — J302 Other seasonal allergic rhinitis: Secondary | ICD-10-CM

## 2022-04-28 NOTE — Telephone Encounter (Signed)
Requested Prescriptions  Pending Prescriptions Disp Refills   montelukast (SINGULAIR) 10 MG tablet [Pharmacy Med Name: MONTELUKAST 10MG  TABLETS] 90 tablet 3    Sig: TAKE 1 TABLET(10 MG) BY MOUTH AT BEDTIME     Pulmonology:  Leukotriene Inhibitors Passed - 04/27/2022  8:04 PM      Passed - Valid encounter within last 12 months    Recent Outpatient Visits           11 months ago Seasonal allergic rhinitis, unspecified trigger   [facility] [place], [place], DO   2 years ago Back pain with right-sided radiculopathy   [facility], FNP   2 years ago Annual physical exam   [facility], DO   3 years ago Anxiety   [facility] [place], [place], DO   3 years ago Anxiety   [facility], [place], Netta Neat

## 2022-04-30 MED ORDER — MONTELUKAST SODIUM 10 MG PO TABS: 10.0000 mg | ORAL_TABLET | Freq: Every day | ORAL | 0 refills | Status: DC

## 2022-04-30 NOTE — Addendum Note (Signed)
Addended by: Smitty Cords on: 04/30/2022 01:31 PM   Modules accepted: Orders

## 2022-05-21 ENCOUNTER — Other Ambulatory Visit: Payer: Self-pay | Admitting: Family Medicine

## 2022-05-21 DIAGNOSIS — J302 Other seasonal allergic rhinitis: Secondary | ICD-10-CM

## 2022-05-21 NOTE — Telephone Encounter (Signed)
Requested medication (s) are due for refill today - expired Rx  Requested medication (s) are on the active medication list -yes  Future visit scheduled -yes  Last refill: 05/06/21 48g 3RF  Notes to clinic: Call to patient- scheduled appointment- request for RF sent to provider- expired Rx  Requested Prescriptions  Pending Prescriptions Disp Refills   fluticasone (FLONASE) 50 MCG/ACT nasal spray [Pharmacy Med Name: FLUTICASONE 50MCG NASAL SP (120) RX] 48 g 3    Sig: SHAKE LIQUID AND USE 2 SPRAYS IN EACH NOSTRIL DAILY     Ear, Nose, and Throat: Nasal Preparations - Corticosteroids Failed - 05/21/2022 10:04 AM      Failed - Valid encounter within last 12 months    Recent Outpatient Visits           1 year ago Seasonal allergic rhinitis, unspecified trigger   [place], DO   2 years ago Back pain with right-sided radiculopathy   [place], FNP   2 years ago Annual physical exam   [facility] Olin Hauser, DO   3 years ago [place], Devonne Doughty, DO   3 years ago [place], DO       Future Appointments             In 1 week [place], Devonne Doughty, DO [facility]               Requested Prescriptions  Pending Prescriptions Disp Refills   fluticasone (FLONASE) 50 MCG/ACT nasal spray [Pharmacy Med Name: FLUTICASONE 50MCG NASAL SP (120) RX] 48 g 3    Sig: SHAKE LIQUID AND USE 2 SPRAYS IN EACH NOSTRIL DAILY     Ear, Nose, and Throat: Nasal Preparations - Corticosteroids Failed - 05/21/2022 10:04 AM      Failed - Valid encounter within last 12 months    Recent Outpatient Visits           1 year ago Seasonal allergic rhinitis, unspecified trigger   [place], DO   2 years ago Back pain with right-sided  radiculopathy   [place], FNP   2 years ago Annual physical exam   [facility] Olin Hauser, DO   3 years ago [place], Devonne Doughty, DO   3 years ago [place], DO       Future Appointments             In 1 week [place], Devonne Doughty, [facility], [facility]

## 2022-06-01 ENCOUNTER — Encounter: Payer: Self-pay | Admitting: Family Medicine

## 2022-06-01 ENCOUNTER — Ambulatory Visit: Payer: Managed Care, Other (non HMO) | Admitting: Family Medicine

## 2022-06-01 VITALS — Wt 194.4 lb

## 2022-06-01 DIAGNOSIS — R059 Cough, unspecified: Secondary | ICD-10-CM | POA: Diagnosis not present

## 2022-06-01 DIAGNOSIS — U071 COVID-19: Secondary | ICD-10-CM

## 2022-06-01 DIAGNOSIS — R0981 Nasal congestion: Secondary | ICD-10-CM

## 2022-06-01 LAB — POC INFLUENZA A&B (BINAX/QUICKVUE)
Influenza A, POC: NEGATIVE
Influenza B, POC: NEGATIVE

## 2022-06-01 LAB — POC COVID19 BINAXNOW: SARS Coronavirus 2 Ag: POSITIVE — AB

## 2022-06-01 MED ORDER — IPRATROPIUM BROMIDE 0.06 % NA SOLN: 2.0000 | Freq: Four times a day (QID) | NASAL | 0 refills | Status: DC

## 2022-06-01 MED ORDER — GUAIFENESIN-CODEINE 100-10 MG/5ML PO SYRP: 5.0000 mL | ORAL_SOLUTION | Freq: Four times a day (QID) | ORAL | 0 refills | Status: DC | PRN

## 2022-06-01 NOTE — Patient Instructions (Addendum)

## 2022-06-01 NOTE — Progress Notes (Signed)
Subjective:    Patient ID: Haley Woods, female    DOB: [birth year], 62 y.o.   MRN: [id number]  Haley Woods is a 62 y.o. female presenting on 06/01/2022 for Cough and Nasal Congestion   HPI  COVID-19 Onset Friday last week 1/12 Initial sinus symptoms only, congestion Occasional HR fluctuation elevation Did not take home COVID test Multiple employees at work with [place]. Sick contact with Flu Admits cough congestion headache Taking OTC cough medicine sudafed     05/06/2021    3:01 PM 02/02/2020    9:34 AM 03/06/2019    8:08 AM  Depression screen PHQ 2/9  Decreased Interest 0 0 0  Down, Depressed, Hopeless 1 0 1  PHQ - 2 Score 1 0 1  Altered sleeping 0 0 1  Tired, decreased energy 1 1 2   Change in appetite 0 0 0  Feeling bad or failure about yourself  0 0 1  Trouble concentrating 0 0 0  Moving slowly or fidgety/restless 0 0 0  Suicidal thoughts 0 0 0  PHQ-9 Score 2 1 5   Difficult doing work/chores Not difficult at all Not difficult at all Not difficult at all    Social History   Tobacco Use   Smoking status: Never   Smokeless tobacco: Never  Vaping Use   Vaping Use: Never used  Substance Use Topics   Alcohol use: No    Alcohol/week: 0.0 standard drinks of alcohol   Drug use: No    Review of Systems Per HPI unless specifically indicated above     Objective:    Wt 194 lb 6.4 oz (88.2 kg)   LMP 01/17/2016 (Within Weeks)   BMI 36.73 kg/m   Wt Readings from Last 3 Encounters:  06/01/22 194 lb 6.4 oz (88.2 kg)  05/06/21 200 lb 6.4 oz (90.9 kg)  12/31/20 205 lb (93 kg)    Physical Exam Vitals and nursing note reviewed.  Constitutional:      General: She is not in acute distress.    Appearance: Normal appearance. She is well-developed. She is not diaphoretic.     Comments: Well-appearing, comfortable, cooperative  HENT:     Head: Normocephalic and atraumatic.  Eyes:     General:        Right eye: No discharge.        Left eye: No discharge.      Conjunctiva/sclera: Conjunctivae normal.  Cardiovascular:     Rate and Rhythm: Normal rate.  Pulmonary:     Effort: Pulmonary effort is normal.  Skin:    General: Skin is warm and dry.     Findings: No erythema or rash.  Neurological:     Mental Status: She is alert and oriented to person, place, and time.  Psychiatric:        Mood and Affect: Mood normal.        Behavior: Behavior normal.        Thought Content: Thought content normal.     Comments: Well groomed, good eye contact, normal speech and thoughts        Results for orders placed or performed in visit on 06/01/22  POC COVID-19  Result Value Ref Range   SARS Coronavirus 2 Ag Positive (A) Negative  POC Influenza A&B (Binax test)  Result Value Ref Range   Influenza A, POC Negative Negative   Influenza B, POC Negative Negative      Assessment & Plan:   Problem List Items Addressed This Visit  None Visit Diagnoses     COVID-19 virus infection    -  Primary   Relevant Medications   guaiFENesin-codeine (ROBITUSSIN AC) 100-10 MG/5ML syrup   ipratropium (ATROVENT) 0.06 % nasal spray   Other Relevant Orders   POC COVID-19 (Completed)   Nasal congestion       Relevant Medications   ipratropium (ATROVENT) 0.06 % nasal spray   Other Relevant Orders   POC Influenza A&B (Binax test) (Completed)       COVID19 positive Symptom 1st onset 05/29/22 Confirm COVID Positive TODAY in office 06/01/22 Mild improving symptoms currently. No red flags or dyspnea Risk factor age 21+   Offer Paxlovid. She declines. Will treat symptoms only. Quarantine from work protocol she will notify employer. Counseling provided Start Atrovent nasal spray decongestant 2 sprays in each nostril up to 4 times daily for 7 days Codeine cough syrup at night Sudafed OTC Supportive care OTC PRN Follow-up criteria given.   Meds ordered this encounter  Medications   guaiFENesin-codeine (ROBITUSSIN AC) 100-10 MG/5ML syrup    Sig: Take 5-10  mLs by mouth 4 (four) times daily as needed for cough.    Dispense:  200 mL    Refill:  0   ipratropium (ATROVENT) 0.06 % nasal spray    Sig: Place 2 sprays into both nostrils 4 (four) times daily. For up to 5-7 days then stop.    Dispense:  15 mL    Refill:  0      Follow up plan: Return if symptoms worsen or fail to improve.  Nobie Putnam, [facility] 06/01/2022, 4:35 PM

## 2022-06-16 LAB — LIPID PANEL
Cholesterol: 249 — AB (ref 0–200)
HDL: 66 (ref 35–70)
LDL Cholesterol: 171
Triglycerides: 72 (ref 40–160)

## 2022-06-16 LAB — HEMOGLOBIN A1C: Hemoglobin A1C: 5.5

## 2022-07-26 ENCOUNTER — Other Ambulatory Visit: Payer: Self-pay | Admitting: Family Medicine

## 2022-07-26 DIAGNOSIS — J302 Other seasonal allergic rhinitis: Secondary | ICD-10-CM

## 2022-07-27 NOTE — Telephone Encounter (Signed)
Requested Prescriptions  Pending Prescriptions Disp Refills   montelukast (SINGULAIR) 10 MG tablet [Pharmacy Med Name: MONTELUKAST '10MG'$  TABLETS] 30 tablet 0    Sig: TAKE 1 TABLET(10 MG) BY MOUTH AT BEDTIME     Pulmonology:  Leukotriene Inhibitors Passed - 07/26/2022  2:56 PM      Passed - Valid encounter within last 12 months    Recent Outpatient Visits           1 month ago COVID-19 virus infection   [place], DO   1 year ago Seasonal allergic rhinitis, unspecified trigger   [place], DO   2 years ago Back pain with right-sided radiculopathy   [facility] [place], Lupita Raider, FNP   2 years ago Annual physical exam   [facility] Olin Hauser, DO   3 years ago [place], DO       Future Appointments             In 1 week [place], Devonne Doughty, [facility], [facility]

## 2022-08-04 ENCOUNTER — Encounter: Payer: Self-pay | Admitting: Family Medicine

## 2022-08-04 ENCOUNTER — Ambulatory Visit (INDEPENDENT_AMBULATORY_CARE_PROVIDER_SITE_OTHER): Payer: Managed Care, Other (non HMO) | Admitting: Family Medicine

## 2022-08-04 VITALS — BP 126/76 | HR 66 | Ht 61.0 in | Wt 198.6 lb

## 2022-08-04 DIAGNOSIS — Z1211 Encounter for screening for malignant neoplasm of colon: Secondary | ICD-10-CM

## 2022-08-04 DIAGNOSIS — Z1231 Encounter for screening mammogram for malignant neoplasm of breast: Secondary | ICD-10-CM

## 2022-08-04 DIAGNOSIS — Z0000 Encounter for general adult medical examination without abnormal findings: Secondary | ICD-10-CM | POA: Diagnosis not present

## 2022-08-04 DIAGNOSIS — E7800 Pure hypercholesterolemia, unspecified: Secondary | ICD-10-CM | POA: Diagnosis not present

## 2022-08-04 DIAGNOSIS — J302 Other seasonal allergic rhinitis: Secondary | ICD-10-CM

## 2022-08-04 NOTE — Patient Instructions (Addendum)
Thank you for coming to the office today.  [facility] orders.  Keep on the Keto / Low Sugar diet  Goal to keep improving activity exercise  My preference would be for you to sign up with [facility] and pursue this option first  Medications can be possible see below if you are interested.  ---------- Call insurance find cost and coverage of the following - check the following: - Drug Tier, Preferred List, On Formulary - All will require a "Prior Authorization" from [place] first, before you can find out the cost - Find out if there is "Step Therapy" (other medicines required before you can try these)  Once you pick the one you want to try, let me know - we can get a sample ready IF we have it in stock. Then try it - and before running out of medicine, contact me back to order your Rx so we have time to get it processed.  For Weight Loss / Obesity only  Wegovy (same as Ozempic) weekly injection - start 0.25mg  weekly, 1 dose per pen, single use, auto-injector  2. Saxenda - DAILY injection - start 0.6mg  injection DAILY, you can increase the dose by 1 notch or 0.6 mg per week, if you don't tolerate a dose, can reduce it the next day.  3. Zepbound (same as Mounjaro) weekly injection  4. Contrave - oral medication, appetite suppression has wellbutrin/bupropion and naltrexone in it and it can also help with appetite, it is ordered through a speciality pharmacy.   Future make sure your insurance has weight loss coverage  WEIGHT MANAGEMENT  Dr Dennard Nip  [facility] [address], [place] [address] Ph: [phone]  ----------------------------  For Mammogram screening for breast cancer   Call the [facility] below anytime to schedule your own appointment now that order has been placed.  [facility] [place], [address] [place], [place] [address] Phone: [phone]    Please schedule a Follow-up Appointment to: Return in about 6 months (around 02/04/2023) for 6 month follow-up weight .  If you have any other questions or concerns, please feel free to call the office or send a message through [facility]. You may also schedule an earlier appointment if necessary.  Additionally, you may be receiving a survey about your experience at our office within a few days to 1 week by e-mail or mail. We value your feedback.  Nobie Putnam, DO [place]

## 2022-08-04 NOTE — Progress Notes (Signed)
Subjective:    Patient ID: Haley Woods, female    DOB: May 06, 1961, 62 y.o.   MRN: [id number]  Haley Woods is a 62 y.o. female presenting on 08/04/2022 for Annual Exam (Pt. Wants to talk about her allergies and medication refill.)   HPI  Here for Annual Physical and Lab Orders  Seasonal Allergies Tried Montelukast Doing well on Zyrtec, was too sleepy on it., now taking it at night. Doing much better than Allegra  Hyperlipidemia Last lipid panel done by [facility] biometric below, with LDL 170s  Obesity BMI >37 Keto carnivore diet, inc meat She has Joined a program, can sign up with [facility]. A1c prior 5.6 to 5.5 now, slightly elevated but normal range, see below Weight down to 194-198 lbs  Back Surgery scared her goal to get weight off  On feet constantly but less exercise    Health Maintenance: Due for cancer screening tests see below orders.     08/04/2022    9:53 AM 05/06/2021    3:01 PM 02/02/2020    9:34 AM  Depression screen PHQ 2/9  Decreased Interest 0 0 0  Down, Depressed, Hopeless 1 1 0  PHQ - 2 Score 1 1 0  Altered sleeping 1 0 0  Tired, decreased energy 1 1 1   Change in appetite 0 0 0  Feeling bad or failure about yourself  1 0 0  Trouble concentrating 0 0 0  Moving slowly or fidgety/restless 0 0 0  Suicidal thoughts 0 0 0  PHQ-9 Score 4 2 1   Difficult doing work/chores Not difficult at all Not difficult at all Not difficult at all      08/04/2022    9:53 AM 05/06/2021    3:01 PM 03/06/2019    8:09 AM 10/27/2018   11:22 AM  GAD 7 : Generalized Anxiety Score  Nervous, Anxious, on Edge 1 1 2 3   Control/stop worrying 1 1 1  0  Worry too much - different things 1 1 1  0  Trouble relaxing 1 1 1 3   Restless 1 0 0 0  Easily annoyed or irritable 0 0 0 1  Afraid - awful might happen 0 1 1 0  Total GAD 7 Score 5 5 6 7   Anxiety Difficulty Somewhat difficult Not difficult at all Not difficult at all Not difficult at all     Social History    Tobacco Use   Smoking status: Never   Smokeless tobacco: Never  Vaping Use   Vaping Use: Never used  Substance Use Topics   Alcohol use: No    Alcohol/week: 0.0 standard drinks of alcohol   Drug use: No    Review of Systems  Constitutional:  Negative for activity change, appetite change, chills, diaphoresis, fatigue and fever.  HENT:  Negative for congestion and hearing loss.   Eyes:  Negative for visual disturbance.  Respiratory:  Negative for cough, chest tightness, shortness of breath and wheezing.   Cardiovascular:  Negative for chest pain, palpitations and leg swelling.  Gastrointestinal:  Negative for abdominal pain, constipation, diarrhea, nausea and vomiting.  Genitourinary:  Negative for dysuria, frequency and hematuria.  Musculoskeletal:  Negative for arthralgias and neck pain.  Skin:  Negative for rash.  Neurological:  Negative for dizziness, weakness, light-headedness, numbness and headaches.  Hematological:  Negative for adenopathy.  Psychiatric/Behavioral:  Negative for behavioral problems, dysphoric mood and sleep disturbance.    Per HPI unless specifically indicated above     Objective:  BP 126/76 (BP Location: Right Arm, Patient Position: Sitting)   Pulse 66   Ht 5\' 1"  (1.549 m)   Wt 198 lb 9.6 oz (90.1 kg)   LMP 01/17/2016 (Within Weeks)   SpO2 98%   BMI 37.53 kg/m   Wt Readings from Last 3 Encounters:  08/04/22 198 lb 9.6 oz (90.1 kg)  06/01/22 194 lb 6.4 oz (88.2 kg)  05/06/21 200 lb 6.4 oz (90.9 kg)    Physical Exam Vitals and nursing note reviewed.  Constitutional:      General: She is not in acute distress.    Appearance: She is well-developed. She is obese. She is not diaphoretic.     Comments: Well-appearing, comfortable, cooperative  HENT:     Head: Normocephalic and atraumatic.  Eyes:     General:        Right eye: No discharge.        Left eye: No discharge.     Conjunctiva/sclera: Conjunctivae normal.     Pupils: Pupils are  equal, round, and reactive to light.  Neck:     Thyroid: No thyromegaly.     Vascular: No carotid bruit.  Cardiovascular:     Rate and Rhythm: Normal rate and regular rhythm.     Pulses: Normal pulses.     Heart sounds: Normal heart sounds. No murmur heard. Pulmonary:     Effort: Pulmonary effort is normal. No respiratory distress.     Breath sounds: Normal breath sounds. No wheezing or rales.  Abdominal:     General: Bowel sounds are normal. There is no distension.     Palpations: Abdomen is soft. There is no mass.     Tenderness: There is no abdominal tenderness.  Musculoskeletal:        General: No tenderness. Normal range of motion.     Cervical back: Normal range of motion and neck supple.     Right lower leg: No edema.     Left lower leg: No edema.     Comments: Upper / Lower Extremities: - Normal muscle tone, strength bilateral upper extremities 5/5, lower extremities 5/5  Lymphadenopathy:     Cervical: No cervical adenopathy.  Skin:    General: Skin is warm and dry.     Findings: No erythema or rash.  Neurological:     Mental Status: She is alert and oriented to person, place, and time.     Comments: Distal sensation intact to light touch all extremities  Psychiatric:        Mood and Affect: Mood normal.        Behavior: Behavior normal.        Thought Content: Thought content normal.     Comments: Well groomed, good eye contact, normal speech and thoughts     Results for orders placed or performed in visit on 08/04/22  Lipid panel  Result Value Ref Range   Triglycerides 72 40 - 160   Cholesterol 249 (A) 0 - 200   HDL 66 35 - 70   LDL Cholesterol 171   Hemoglobin A1c  Result Value Ref Range   Hemoglobin A1C 5.5       Assessment & Plan:   Problem List Items Addressed This Visit     Allergic rhinitis, seasonal   Relevant Medications   cetirizine (ZYRTEC) 10 MG tablet   Hyperlipidemia   Relevant Orders   Comprehensive metabolic panel   TSH   T4, free    Morbid obesity ([place])   Relevant  Orders   Comprehensive metabolic panel   CBC with Differential/Platelet   TSH   T4, free   Screening for colon cancer   Relevant Orders   Cologuard   Other Visit Diagnoses     Annual physical exam    -  Primary   Relevant Orders   Comprehensive metabolic panel   CBC with Differential/Platelet   TSH   T4, free   Encounter for screening mammogram for malignant neoplasm of breast       Relevant Orders   MM 3D SCREENING MAMMOGRAM BILATERAL BREAST       Updated Health Maintenance information [facility] orders printed, given to patient to get drawn 1-2 weeks Encouraged improvement to lifestyle with diet and exercise Goal of weight loss  Allergies Continue Cetirizine  [facility] orders. Printed.  Keep on the Keto / Low Sugar diet  Goal to keep improving activity exercise  My preference would be for you to sign up with [facility] and pursue this option first  Medications can be possible see below if you are interested. Per AVS   Discussion about screening tests  Ordered Mammogram  Due for routine colon cancer screening. Never had colonoscopy (not interested), no family history colon cancer. - Discussion today about recommendations for either Colonoscopy or Cologuard screening, benefits and risks of screening, interested in Cologuard, understands that if positive then recommendation is for diagnostic colonoscopy to follow-up. - Ordered Cologuard today  Orders Placed This Encounter  Procedures   MM 3D SCREENING MAMMOGRAM BILATERAL BREAST    Standing Status:   Future    Standing Expiration Date:   08/04/2023    Order Specific Question:   Reason for Exam (SYMPTOM  OR DIAGNOSIS REQUIRED)    Answer:   Screening bilateral 3D Mammogram [place]    Order Specific Question:   Preferred imaging location?    Answer:   [facility]   Comprehensive metabolic panel    Order Specific Question:   Has the patient fasted?    Answer:   Yes   CBC with  Differential/Platelet   TSH   T4, free   Cologuard   Lipid panel    This external order was created through the Results Console.   Hemoglobin A1c    This external order was created through the Results Console.      No orders of the defined types were placed in this encounter.     Follow up plan: Return in about 6 months (around 02/04/2023) for 6 month follow-up weight .   Nobie Putnam, DO [facility] 08/04/2022, 10:18 AM

## 2022-09-01 LAB — CBC WITH DIFFERENTIAL/PLATELET
Basophils Absolute: 0 10*3/uL (ref 0.0–0.2)
Basos: 0 %
EOS (ABSOLUTE): 0.1 10*3/uL (ref 0.0–0.4)
Eos: 1 %
Hematocrit: 41.3 % (ref 34.0–46.6)
Hemoglobin: 13.4 g/dL (ref 11.1–15.9)
Immature Grans (Abs): 0 10*3/uL (ref 0.0–0.1)
Immature Granulocytes: 0 %
Lymphocytes Absolute: 3 10*3/uL (ref 0.7–3.1)
Lymphs: 42 %
MCH: 29 pg (ref 26.6–33.0)
MCHC: 32.4 g/dL (ref 31.5–35.7)
MCV: 89 fL (ref 79–97)
Monocytes Absolute: 0.4 10*3/uL (ref 0.1–0.9)
Monocytes: 5 %
Neutrophils Absolute: 3.7 10*3/uL (ref 1.4–7.0)
Neutrophils: 52 %
Platelets: 231 10*3/uL (ref 150–450)
RBC: 4.62 x10E6/uL (ref 3.77–5.28)
RDW: 13.1 % (ref 11.7–15.4)
WBC: 7.1 10*3/uL (ref 3.4–10.8)

## 2022-09-01 LAB — COMPREHENSIVE METABOLIC PANEL
ALT: 13 IU/L (ref 0–32)
AST: 14 IU/L (ref 0–40)
Albumin/Globulin Ratio: 1.8 (ref 1.2–2.2)
Albumin: 4.2 g/dL (ref 3.8–4.9)
Alkaline Phosphatase: 79 IU/L (ref 44–121)
BUN/Creatinine Ratio: 22 (ref 12–28)
BUN: 17 mg/dL (ref 8–27)
Bilirubin Total: <0.2 mg/dL (ref 0.0–1.2)
CO2: 23 mmol/L (ref 20–29)
Calcium: 9.1 mg/dL (ref 8.7–10.3)
Chloride: 108 mmol/L — ABNORMAL HIGH (ref 96–106)
Creatinine, Ser: 0.76 mg/dL (ref 0.57–1.00)
Globulin, Total: 2.4 g/dL (ref 1.5–4.5)
Glucose: 111 mg/dL — ABNORMAL HIGH (ref 70–99)
Potassium: 4.6 mmol/L (ref 3.5–5.2)
Sodium: 143 mmol/L (ref 134–144)
Total Protein: 6.6 g/dL (ref 6.0–8.5)
eGFR: 90 mL/min/{1.73_m2} (ref >59)

## 2022-09-01 LAB — TSH: TSH: 4.82 u[IU]/mL — ABNORMAL HIGH (ref 0.450–4.500)

## 2022-09-01 LAB — T4, FREE: Free T4: 1.07 ng/dL (ref 0.82–1.77)

## 2022-09-04 ENCOUNTER — Encounter: Payer: Self-pay | Admitting: Family Medicine

## 2022-09-09 ENCOUNTER — Ambulatory Visit (INDEPENDENT_AMBULATORY_CARE_PROVIDER_SITE_OTHER): Payer: Managed Care, Other (non HMO) | Admitting: Family Medicine

## 2022-09-09 ENCOUNTER — Encounter: Payer: Self-pay | Admitting: Family Medicine

## 2022-09-09 VITALS — BP 136/78 | HR 85 | Ht 61.0 in | Wt 200.0 lb

## 2022-09-09 DIAGNOSIS — M7552 Bursitis of left shoulder: Secondary | ICD-10-CM

## 2022-09-09 MED ORDER — CYCLOBENZAPRINE HCL 10 MG PO TABS: 5.0000 mg | ORAL_TABLET | Freq: Every evening | ORAL | 2 refills | Status: AC | PRN

## 2022-09-09 MED ORDER — NAPROXEN 500 MG PO TABS: 500.0000 mg | ORAL_TABLET | Freq: Two times a day (BID) | ORAL | 1 refills | Status: AC

## 2022-09-09 NOTE — Progress Notes (Signed)
Subjective:    Patient ID: Haley Woods, female    DOB: January 03, 1961, 62 y.o.   MRN: [id number]  Haley Woods is a 62 y.o. female presenting on 09/09/2022 for Shoulder Pain (Left side, present 3 weeks)   HPI  Left Shoulder Pain Past history of L shoulder issue Reports that has had flare up recently, worse with repetitive motion at work with overuse. She says no actual inciting injury or trauma. She describes pain with day to day movements, putting jacket on, lifting arm up, reaching back or using pressure with her arm. Admits difficulty with height at work due to needing to reach, not setup She admits some numbness tingling in left hand palm and various fingers. No recent x-ray or imaging Previously working with [employer] and has had imaging of neck, had some radiating symptoms Wakes her up at night  Tried Deep Blue and essential oils No medications tried      08/04/2022    9:53 AM 05/06/2021    3:01 PM 02/02/2020    9:34 AM  Depression screen PHQ 2/9  Decreased Interest 0 0 0  Down, Depressed, Hopeless 1 1 0  PHQ - 2 Score 1 1 0  Altered sleeping 1 0 0  Tired, decreased energy 1 1 1   Change in appetite 0 0 0  Feeling bad or failure about yourself  1 0 0  Trouble concentrating 0 0 0  Moving slowly or fidgety/restless 0 0 0  Suicidal thoughts 0 0 0  PHQ-9 Score 4 2 1   Difficult doing work/chores Not difficult at all Not difficult at all Not difficult at all    Social History   Tobacco Use   Smoking status: Never   Smokeless tobacco: Never  Vaping Use   Vaping Use: Never used  Substance Use Topics   Alcohol use: No    Alcohol/week: 0.0 standard drinks of alcohol   Drug use: No    Review of Systems Per HPI unless specifically indicated above     Objective:    BP 136/78   Pulse 85   Ht 5\' 1"  (1.549 m)   Wt 200 lb (90.7 kg)   LMP 01/17/2016 (Within Weeks)   SpO2 98%   BMI 37.79 kg/m   Wt Readings from Last 3 Encounters:  09/09/22 200 lb (90.7 kg)   08/04/22 198 lb 9.6 oz (90.1 kg)  06/01/22 194 lb 6.4 oz (88.2 kg)    Physical Exam Vitals and nursing note reviewed.  Constitutional:      General: She is not in acute distress.    Appearance: Normal appearance. She is well-developed. She is not diaphoretic.     Comments: Well-appearing, comfortable, cooperative  HENT:     Head: Normocephalic and atraumatic.  Eyes:     General:        Right eye: No discharge.        Left eye: No discharge.     Conjunctiva/sclera: Conjunctivae normal.  Cardiovascular:     Rate and Rhythm: Normal rate.  Pulmonary:     Effort: Pulmonary effort is normal.  Musculoskeletal:     Comments: LEFT Shoulder Inspection: Normal appearance bilateral symmetrical Palpation: Non-tender to palpation over anterior, lateral, or posterior shoulder  ROM: Reduced Left internal rotation behind back due to pain, overhead flexion abduction with pain. Special Testing: Rotator cuff testing positive for some pain with supraspinatus full can and empty can test, Impingement testing positive for pain Strength: Normal strength 5/5 flex/ext, ext rot /  int rot, grip, rotator cuff str testing. Neurovascular: Distally intact pulses, sensation to light touch   Skin:    General: Skin is warm and dry.     Findings: No erythema or rash.  Neurological:     Mental Status: She is alert and oriented to person, place, and time.  Psychiatric:        Mood and Affect: Mood normal.        Behavior: Behavior normal.        Thought Content: Thought content normal.     Comments: Well groomed, good eye contact, normal speech and thoughts       Results for orders placed or performed in visit on 08/04/22  Comprehensive metabolic panel  Result Value Ref Range   Glucose 111 (H) 70 - 99 mg/dL   BUN 17 8 - 27 mg/dL   Creatinine, Ser 0.98 0.57 - 1.00 mg/dL   eGFR 90 >11 BJ/YNW/2.95   BUN/Creatinine Ratio 22 12 - 28   Sodium 143 134 - 144 mmol/L   Potassium 4.6 3.5 - 5.2 mmol/L    Chloride 108 (H) 96 - 106 mmol/L   CO2 23 20 - 29 mmol/L   Calcium 9.1 8.7 - 10.3 mg/dL   Total Protein 6.6 6.0 - 8.5 g/dL   Albumin 4.2 3.8 - 4.9 g/dL   Globulin, Total 2.4 1.5 - 4.5 g/dL   Albumin/Globulin Ratio 1.8 1.2 - 2.2   Bilirubin Total <0.2 0.0 - 1.2 mg/dL   Alkaline Phosphatase 79 44 - 121 IU/L   AST 14 0 - 40 IU/L   ALT 13 0 - 32 IU/L  CBC with Differential/Platelet  Result Value Ref Range   WBC 7.1 3.4 - 10.8 x10E3/uL   RBC 4.62 3.77 - 5.28 x10E6/uL   Hemoglobin 13.4 11.1 - 15.9 g/dL   Hematocrit 62.1 30.8 - 46.6 %   MCV 89 79 - 97 fL   MCH 29.0 26.6 - 33.0 pg   MCHC 32.4 31.5 - 35.7 g/dL   RDW 65.7 84.6 - 96.2 %   Platelets 231 150 - 450 x10E3/uL   Neutrophils 52 Not Estab. %   Lymphs 42 Not Estab. %   Monocytes 5 Not Estab. %   Eos 1 Not Estab. %   Basos 0 Not Estab. %   Neutrophils Absolute 3.7 1.4 - 7.0 x10E3/uL   Lymphocytes Absolute 3.0 0.7 - 3.1 x10E3/uL   Monocytes Absolute 0.4 0.1 - 0.9 x10E3/uL   EOS (ABSOLUTE) 0.1 0.0 - 0.4 x10E3/uL   Basophils Absolute 0.0 0.0 - 0.2 x10E3/uL   Immature Granulocytes 0 Not Estab. %   Immature Grans (Abs) 0.0 0.0 - 0.1 x10E3/uL  TSH  Result Value Ref Range   TSH 4.820 (H) 0.450 - 4.500 uIU/mL  T4, free  Result Value Ref Range   Free T4 1.07 0.82 - 1.77 ng/dL  Lipid panel  Result Value Ref Range   Triglycerides 72 40 - 160   Cholesterol 249 (A) 0 - 200   HDL 66 35 - 70   LDL Cholesterol 171   Hemoglobin A1c  Result Value Ref Range   Hemoglobin A1C 5.5       Assessment & Plan:   Problem List Items Addressed This Visit   None Visit Diagnoses     Acute bursitis of left shoulder    -  Primary   Relevant Medications   naproxen (NAPROSYN) 500 MG tablet   cyclobenzaprine (FLEXERIL) 10 MG tablet  Consistent with acute LEFT-shoulder bursitis vs rotator cuff tendinopathy with some reduced active ROM but without significant evidence of muscle tear (no weakness).  Known repetitive overhead/strenuous  activity as likely etiology  No clear etiology of injury.  62  yr old patient with suspected likely underlying arthritis - No imaging on chart  Plan: 1. Start rx Naproxen  twice daily (with food) for 2 weeks, then as needed - Add Flexeril 5-10mg  AT NIGHT PRN 2. May take Tylenol Ex Str 1-2 q 6 hr PRN 3. Relative rest but keep shoulder mobile, demonstrated ROM exercises, avoid heavy lifting 4. May try heating pad PRN 5. Follow-up 4-6 weeks if not improved for re-evaluation, consider referral to Physical Therapy, X-rays, and or subacromial steroid injection   Meds ordered this encounter  Medications   naproxen (NAPROSYN) 500 MG tablet    Sig: Take 1 tablet (500 mg total) by mouth 2 (two) times daily with a meal. For 2-4 weeks then as needed    Dispense:  60 tablet    Refill:  1   cyclobenzaprine (FLEXERIL) 10 MG tablet    Sig: Take 0.5-1 tablets (5-10 mg total) by mouth at bedtime as needed for muscle spasms.    Dispense:  30 tablet    Refill:  2      Follow up plan: Return in about 4 weeks (around 10/07/2022), or if symptoms worsen or fail to improve, for 4-6 weeks shoulder pain, consider injection.   Saralyn Pilar, DO [facility] 09/09/2022, 4:33 PM

## 2022-09-09 NOTE — Patient Instructions (Addendum)
Thank you for coming to the office today.  Most likely you have bursitis of your shoulder. This is inflammation of the shoulder joint caused most often by arthritis or wear and tear. Often it can flare up to cause bursitis due to repetitive activities or other triggers. It may take time to heal, possibly 2 to 6 weeks, and it is important to avoid over use of shoulder especially above head motions that can re-aggravate the problem.  Recommend trial of Anti-inflammatory with Naproxen (Naprosyn)  tabs - take one with food and plenty of water TWICE daily every day (breakfast and dinner), for next 2 to 4 weeks, then you may take only as needed  - DO NOT TAKE any ibuprofen, aleve, motrin while you are taking this medicine  Recommend to start taking Tylenol Extra Strength  tabs - take 1 to 2 tabs per dose (max ) every 6-8 hours for pain (take regularly, don't skip a dose for next 7 days), max 24 hour daily dose is 6 tablets or . In the future you can repeat the same everyday Tylenol course for 1-2 weeks at a time.  Future X-ray if need want to consider injection  Also we can refer you to [facility] if just need to improve on range of motion.  Lastly if not improved by about 4-6 weeks - or just need treatment sooner - call and return for a Steroid Injection in shoulder.   Please schedule a Follow-up Appointment to: Return in about 4 weeks (around 10/07/2022), or if symptoms worsen or fail to improve, for 4-6 weeks shoulder pain, consider injection.  If you have any other questions or concerns, please feel free to call the office or send a message through MyChart. You may also schedule an earlier appointment if necessary.  Additionally, you may be receiving a survey about your experience at our office within a few days to 1 week by e-mail or mail. We value your feedback.  Saralyn Pilar, DO [facility], [facility]  Range of Motion Shoulder  Exercises  Pendulum Circles - Lean with your good arm against a counter or table for support [facility] forward with a wide stance (make sure your body is comfortable) - Your painful shoulder should hang down and feel "heavy" - Gently move your painful arm in small circles "clockwise" for several turns - Switch to "counterclockwise" for several turns - Early on keep circles narrow and move slowly - Later in rehab, move in larger circles and faster movement   Wall Crawl - Stand close (about 1-2 ft away) to a wall, facing it directly - Reach out with your arm of painful shoulder and place fingers (not palm) on wall - You should make contact with wall at your waist level - Slowly walk your fingers up the wall. Stay in contact with wall entire time, do not remove fingers - Keep walking fingers up wall until you reach shoulder level - You may feel tightening or mild discomfort, once you reach a height that causes pain or if you are already above your shoulder height then stop. Repeat from starting position. - Early on stand closer to wall, move fingers slowly, and stay at or below shoulder level - Later in rehab, stand farther away from wall (fingertips), move fingers quicker, go above shoulder level

## 2022-10-21 IMAGING — RF DG C-ARM 1-60 MIN
1 series · 5 of 5 positions shown · non-contrast
Comparison: None.

FLUOROSCOPY TIME:  7 seconds

CLINICAL DATA: L5-S1 discectomy

EXAM:
LUMBAR SPINE - 2-3 VIEW; DG C-ARM 1-60 MIN

[Series 1: dg x-ray · 0.20mm/px · 5 of 5 slices shown]
[im 1/5]
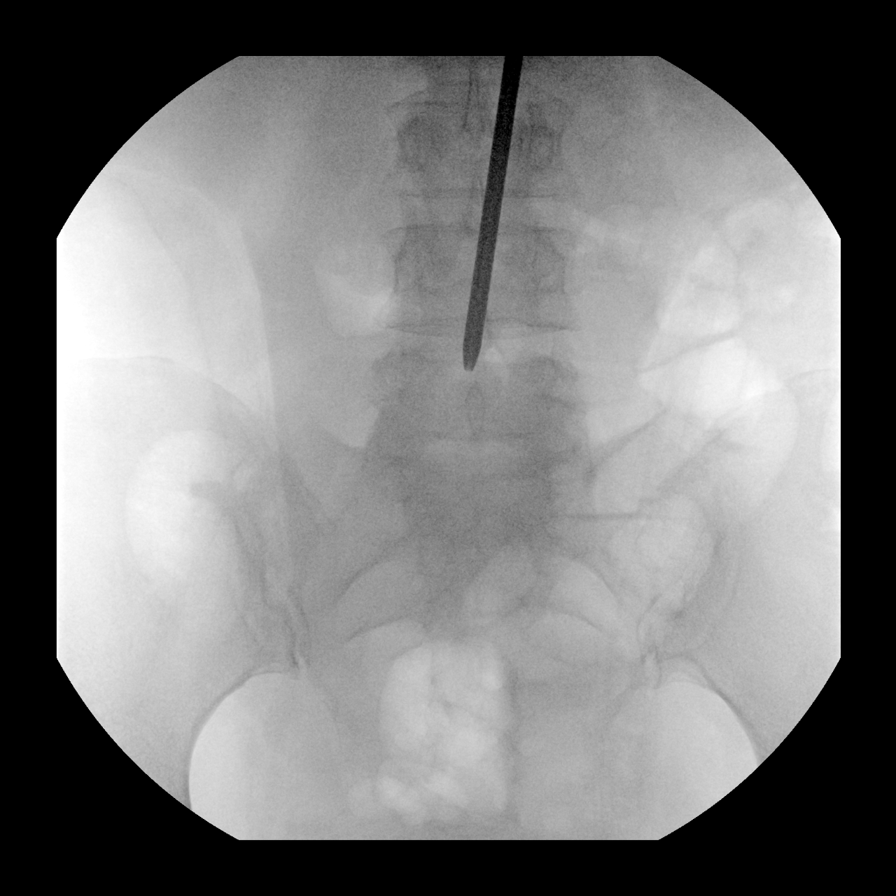
[im 2/5]
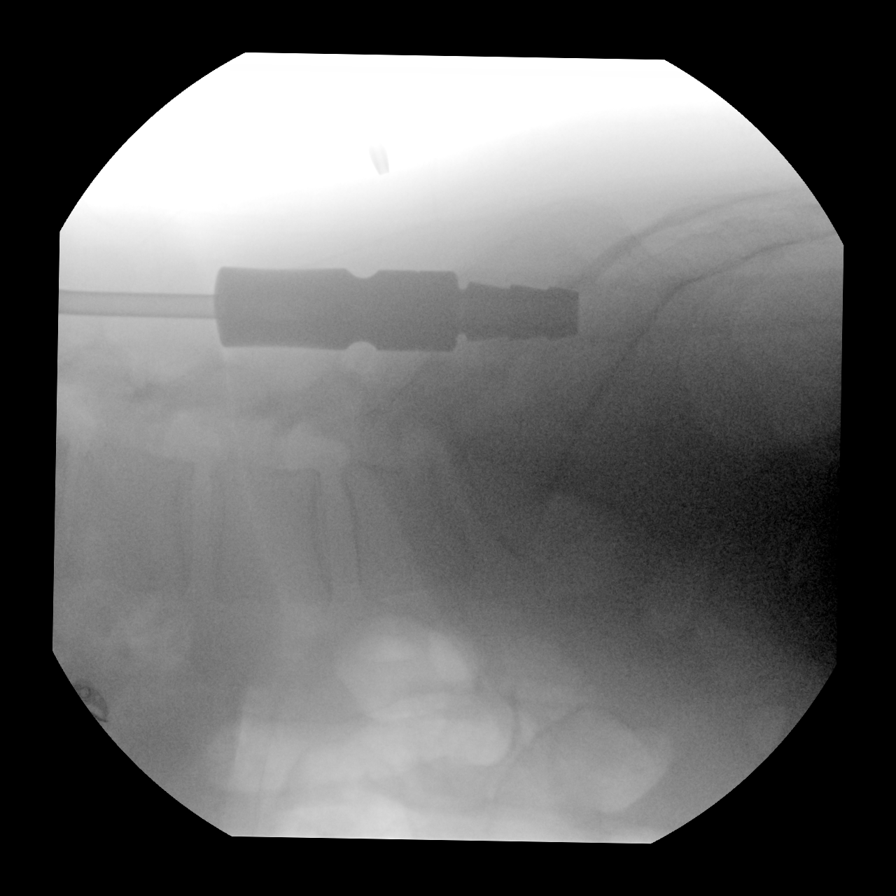
[im 3/5]
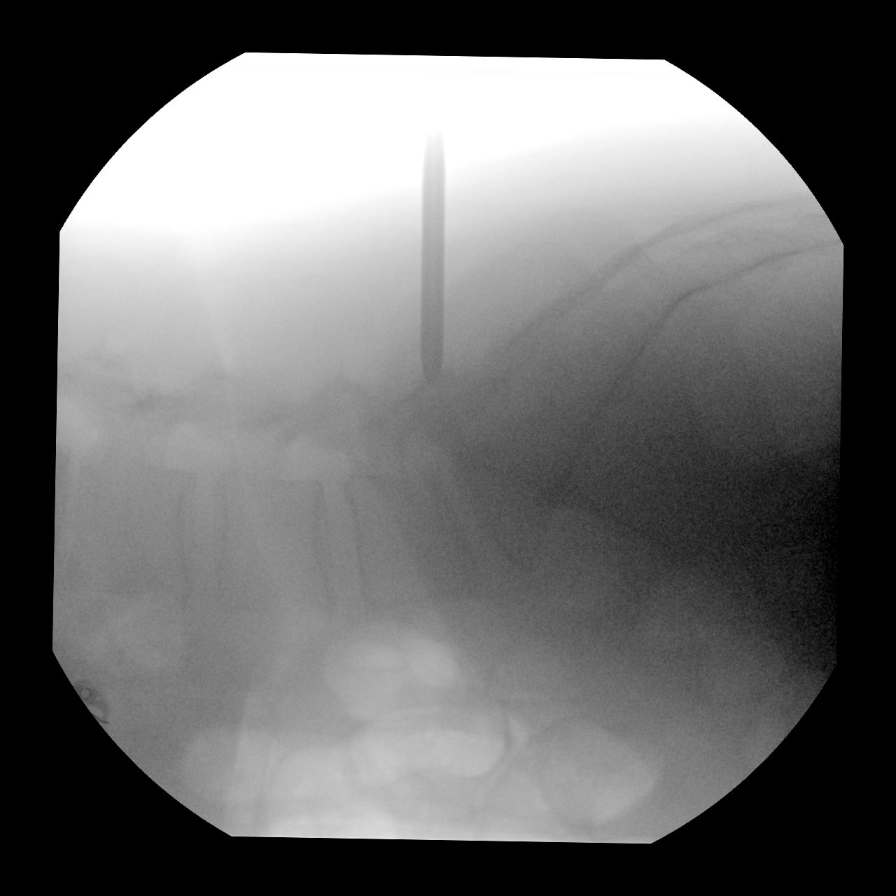
[im 4/5]
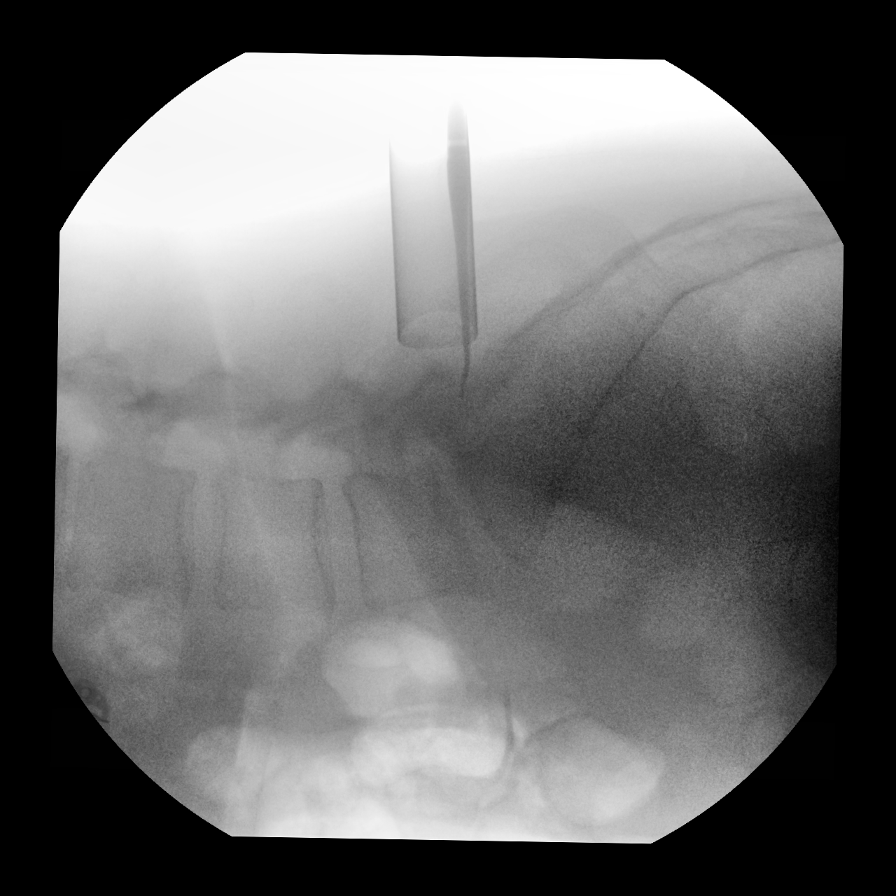
[im 5/5]
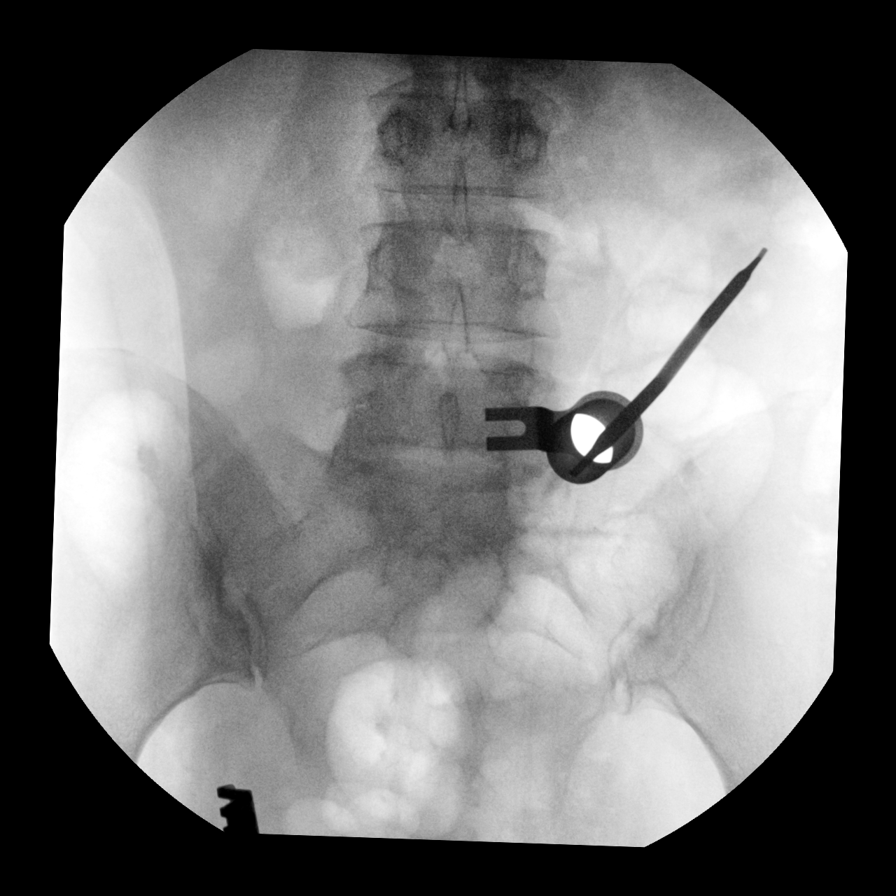

[5 of 5 positions shown; findings below may reference images not displayed]

FINDINGS: Intraoperative fluoroscopic images of the lumbar spine demonstrate
instrument marking and operative exposure at the left aspect of the
L5-S1 disc space.
IMPRESSION: Fluoroscopic guidance for L5-S1 discectomy.

## 2024-04-20 ENCOUNTER — Ambulatory Visit: Payer: Self-pay

## 2024-04-20 NOTE — Telephone Encounter (Signed)
 FYI Only or Action Required?: FYI only for provider: home monitoring.  Patient was last seen in primary care on 09/09/2022 by Edman Marsa PARAS, DO.  Called Nurse Triage reporting Cough.  Symptoms began a week ago.  Interventions attempted: OTC medications: cough syrup, flonase , zyrtec, mucinex  and Rest, hydration, or home remedies.  Symptoms are: gradually improving.  Triage Disposition: Home Care  Patient/caregiver understands and will follow disposition?: Yes  Message from Taleah C sent at 04/20/2024 10:19 AM EST  Reason for Triage: Coughing for over a week and has been taking mucinex  but sxs not improved. Has a hxs of allergies as well.   Reason for Disposition  Cough with cold symptoms (e.g., runny nose, postnasal drip, throat clearing)  Answer Assessment - Initial Assessment Questions 1 week of cough, nasal congestion, and mild wheezing. Started off as allergies potentially but has not settled into her chest. She is gradually feeling better.   Mucinex -DM, flonase , zyrtec  --Rinses her nostrils daily- Cough syrup w codeine  at night-- left over from previous infection  Natural ABX- Dutera oils, oregano, tea tree oil, frankincense   Patient would like to continue to monitor at home. Advised on CB/ED/UC precautions. Recommended Virtual UC on Saturday if needed.   1. ONSET: When did the cough begin?      About a week  2. SEVERITY: How bad is the cough today?      Worse at night and with cold weather triggers  3. SPUTUM: Describe the color of your sputum (e.g., none, dry cough; clear, white, yellow, green)     Coughing up clear, clear/white from her nose 4. HEMOPTYSIS: Are you coughing up any blood? If Yes, ask: How much? (e.g., flecks, streaks, tablespoons, etc.)     denies 5. DIFFICULTY BREATHING: Are you having difficulty breathing? If Yes, ask: How bad is it? (e.g., mild, moderate, severe)      Denies  6. FEVER: Do you have a fever? If Yes, ask:  What is your temperature, how was it measured, and when did it start?     denies 7. CARDIAC HISTORY: Do you have any history of heart disease? (e.g., heart attack, congestive heart failure)      denies 8. LUNG HISTORY: Do you have any history of lung disease?  (e.g., pulmonary embolus, asthma, emphysema)     Allergies  9. PE RISK FACTORS: Do you have a history of blood clots? (or: recent major surgery, recent prolonged travel, bedridden)     Denies  10. OTHER SYMPTOMS: Do you have any other symptoms? (e.g., runny nose, wheezing, chest pain)       Nasal drainage, Low energy, wheezing at night (denies having inhaler)  Protocols used: Cough - Acute Non-Productive-A-AH
